# Patient Record
Sex: Female | Born: 1950 | Race: White | Hispanic: No | Marital: Married | Smoking: Never smoker
Health system: Southern US, Community
[De-identification: ages and names within clinical notes are randomized; demographics above are authoritative.]

## PROBLEM LIST (undated history)

## (undated) DIAGNOSIS — F32A Depression, unspecified: Secondary | ICD-10-CM

## (undated) DIAGNOSIS — N390 Urinary tract infection, site not specified: Secondary | ICD-10-CM

## (undated) DIAGNOSIS — G43909 Migraine, unspecified, not intractable, without status migrainosus: Secondary | ICD-10-CM

## (undated) DIAGNOSIS — F329 Major depressive disorder, single episode, unspecified: Secondary | ICD-10-CM

## (undated) DIAGNOSIS — A499 Bacterial infection, unspecified: Secondary | ICD-10-CM

## (undated) DIAGNOSIS — B019 Varicella without complication: Secondary | ICD-10-CM

## (undated) HISTORY — DX: Depression, unspecified: F32.A

## (undated) HISTORY — DX: Major depressive disorder, single episode, unspecified: F32.9

## (undated) HISTORY — DX: Urinary tract infection, site not specified: N39.0

## (undated) HISTORY — DX: Migraine, unspecified, not intractable, without status migrainosus: G43.909

## (undated) HISTORY — DX: Varicella without complication: B01.9

## (undated) HISTORY — DX: Bacterial infection, unspecified: A49.9

---
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
## 2004-10-09 ENCOUNTER — Ambulatory Visit: Payer: Self-pay | Admitting: Internal Medicine

## 2006-02-07 ENCOUNTER — Ambulatory Visit: Payer: Self-pay | Admitting: Internal Medicine

## 2007-02-24 ENCOUNTER — Ambulatory Visit: Payer: Self-pay | Admitting: Internal Medicine

## 2008-03-03 ENCOUNTER — Ambulatory Visit: Payer: Self-pay | Admitting: Internal Medicine

## 2008-03-22 ENCOUNTER — Ambulatory Visit: Payer: Self-pay | Admitting: Internal Medicine

## 2009-04-05 ENCOUNTER — Ambulatory Visit: Payer: Self-pay | Admitting: Internal Medicine

## 2010-04-26 ENCOUNTER — Ambulatory Visit: Payer: Self-pay | Admitting: Internal Medicine

## 2011-04-30 ENCOUNTER — Ambulatory Visit: Payer: Self-pay | Admitting: Internal Medicine

## 2012-01-18 ENCOUNTER — Other Ambulatory Visit: Payer: Self-pay | Admitting: *Deleted

## 2012-01-18 MED ORDER — MELOXICAM 7.5 MG PO TABS: 7.5000 mg | ORAL_TABLET | Freq: Every day | ORAL | Status: DC

## 2012-01-18 NOTE — Telephone Encounter (Signed)
R'cd fax from [employer] for refill of Meloxicam-rx faxed to pharmacy.

## 2012-02-20 ENCOUNTER — Telehealth: Payer: Self-pay | Admitting: Internal Medicine

## 2012-02-20 NOTE — Telephone Encounter (Signed)
Refill request for evista 60 mg tablet  Sig: take 1 tablet daily Patient has an appointment on 04/18/12  Omeprazole dr 20 mg capsu Sig: take 1 capsule daily

## 2012-02-20 NOTE — Telephone Encounter (Signed)
Refill request for burpropion sr 150 mg tablet Take 1 tablet by mouth once a day

## 2012-02-25 MED ORDER — OMEPRAZOLE 20 MG PO CPDR: 20.0000 mg | DELAYED_RELEASE_CAPSULE | Freq: Every day | ORAL | Status: DC

## 2012-02-25 MED ORDER — RALOXIFENE HCL 60 MG PO TABS: 60.0000 mg | ORAL_TABLET | Freq: Every day | ORAL | Status: DC

## 2012-02-25 MED ORDER — BUPROPION HCL ER (SR) 150 MG PO TB12: 150.0000 mg | ORAL_TABLET | Freq: Every day | ORAL | Status: DC

## 2012-02-25 NOTE — Telephone Encounter (Signed)
Sent in to pharmacy.  

## 2012-04-18 ENCOUNTER — Encounter: Payer: Self-pay | Admitting: Internal Medicine

## 2012-04-18 ENCOUNTER — Ambulatory Visit (INDEPENDENT_AMBULATORY_CARE_PROVIDER_SITE_OTHER): Payer: No Typology Code available for payment source | Admitting: Internal Medicine

## 2012-04-18 ENCOUNTER — Other Ambulatory Visit (HOSPITAL_COMMUNITY)
Admission: RE | Admit: 2012-04-18 | Discharge: 2012-04-18 | Disposition: A | Discharge status: Home / Self Care | Payer: No Typology Code available for payment source | Source: Ambulatory Visit | Attending: Internal Medicine | Admitting: Internal Medicine

## 2012-04-18 VITALS — BP 120/80 | HR 75 | Temp 98.0°F | Ht 62.0 in | Wt 121.2 lb

## 2012-04-18 DIAGNOSIS — Z01419 Encounter for gynecological examination (general) (routine) without abnormal findings: Secondary | ICD-10-CM | POA: Insufficient documentation

## 2012-04-18 DIAGNOSIS — Z139 Encounter for screening, unspecified: Secondary | ICD-10-CM

## 2012-04-18 DIAGNOSIS — R5383 Other fatigue: Secondary | ICD-10-CM

## 2012-04-18 DIAGNOSIS — R5381 Other malaise: Secondary | ICD-10-CM

## 2012-04-18 DIAGNOSIS — M899 Disorder of bone, unspecified: Secondary | ICD-10-CM

## 2012-04-18 DIAGNOSIS — M8580 Other specified disorders of bone density and structure, unspecified site: Secondary | ICD-10-CM

## 2012-04-18 DIAGNOSIS — Z1151 Encounter for screening for human papillomavirus (HPV): Secondary | ICD-10-CM | POA: Insufficient documentation

## 2012-04-19 ENCOUNTER — Encounter: Payer: Self-pay | Admitting: Internal Medicine

## 2012-04-19 DIAGNOSIS — M8580 Other specified disorders of bone density and structure, unspecified site: Secondary | ICD-10-CM | POA: Insufficient documentation

## 2012-04-19 MED ORDER — ESTRADIOL 0.1 MG/GM VA CREA: TOPICAL_CREAM | VAGINAL | Status: DC

## 2012-04-19 NOTE — Progress Notes (Signed)
Subjective:    Patient ID: Yolanda Perkins, female    DOB: [birth year], 62 y.o.   MRN: [id number]  HPI 62 year old female with past history of FCD who comes in today for her complete physical exam.  She states she is doing well.  No chest pain or tightness.  No sob.  Breathing stable.  Takes the meloxicam prn.  Controls her pain.  Overall she feels good.  No acid reflux.  Taking omeprazole.  Bowels stable.   Past Medical History  Diagnosis Date  . Chicken pox   . Depression   . Migraines   . Urinary tract bacterial infections     Current Outpatient Prescriptions on File Prior to Visit  Medication Sig Dispense Refill  . buPROPion (WELLBUTRIN SR) 150 MG 12 hr tablet Take 1 tablet (150 mg total) by mouth daily.  30 tablet  1  . Calcium Carbonate-Vit D-Min 600-400 MG-UNIT TABS Take 1 tablet by mouth 2 (two) times daily.      . meloxicam (MOBIC) 7.5 MG tablet Take 1 tablet (7.5 mg total) by mouth daily. With meals  30 tablet  2  . omeprazole (PRILOSEC) 20 MG capsule Take 1 capsule (20 mg total) by mouth daily.  30 capsule  1  . raloxifene (EVISTA) 60 MG tablet Take 1 tablet (60 mg total) by mouth daily.  30 tablet  1  . SUMAtriptan (IMITREX) 50 MG tablet Take 1/2 tablet my mouth as  directed as needed.      . traZODone (DESYREL) 50 MG tablet Take 1/2 tablet by mouth at bedtime as needed.        Review of Systems Patient denies any headache, lightheadedness or dizziness.  No significant sinus or allergy symptoms.  No chest pain, tightness or palpitations.  No increased shortness of breath, cough or congestion. Breathing stable.   No nausea or vomiting.  No abdominal pain or cramping.  No bowel change, such as diarrhea, constipation, BRBPR or melana.  No urine change.   Taking the meloxicam prn.  Tolerating.  Uses estrace cream prn.  Doing well.  No vaginal problems.      Objective:   Physical Exam Filed Vitals:   04/18/12 1423  BP: 120/80  Pulse: 75  Temp: 98 F (69.63 C)   62 year old  female in no acute distress.   HEENT:  Nares- clear.  Oropharynx - without lesions. NECK:  Supple.  Nontender.  No audible bruit.  HEART:  Appears to be regular. LUNGS:  No crackles or wheezing audible.  Respirations even and unlabored.  RADIAL PULSE:  Equal bilaterally.    BREASTS:  No nipple discharge or nipple retraction present.  Could not appreciate any distinct nodules or axillary adenopathy.  ABDOMEN:  Soft, nontender.  Bowel sounds present and normal.  No audible abdominal bruit.  GU:  Normal external genitalia.  Vaginal vault without lesions.  Cervix identified.  Pap performed. Could not appreciate any adnexal masses or tenderness.   RECTAL:  Heme negative.   EXTREMITIES:  No increased edema present.  DP pulses palpable and equal bilaterally.           Assessment & Plan:  INCREASED PSYCHOSOCIAL STRESSORS.  Doing well on the Wellbutrin and trazodone.  Follow.   CARDIOVASCULAR.  Asymptomatic.   FATIGUE.  Did report some fatigue, but overall is doing well.  Will check cbc, met c and tsh.    MSK.  Had knee pain.  Saw Dr Zachery Dauer.  S/p physical therapy.  Uses mobic prn.  Follow.    HEALTH MAINTENANCE.  Physical today.  Mammogram 04/30/11 - BiRADS II.  Schedule a follow up mammogram.  Colonoscopy 12/30/02 - diverticulosis.  Check cholesterol.

## 2012-04-19 NOTE — Assessment & Plan Note (Signed)
Continue calcium and vitamin D, weight bearing exercise.  Check vitamin D level.  Last bone density revealed osteopenia - no significant change.  Continue Evista.

## 2012-04-23 ENCOUNTER — Encounter: Payer: Self-pay | Admitting: *Deleted

## 2012-04-29 ENCOUNTER — Other Ambulatory Visit: Payer: No Typology Code available for payment source

## 2012-05-01 ENCOUNTER — Other Ambulatory Visit: Payer: No Typology Code available for payment source

## 2012-05-06 ENCOUNTER — Other Ambulatory Visit (INDEPENDENT_AMBULATORY_CARE_PROVIDER_SITE_OTHER): Payer: No Typology Code available for payment source

## 2012-05-06 DIAGNOSIS — R5381 Other malaise: Secondary | ICD-10-CM

## 2012-05-06 DIAGNOSIS — R5383 Other fatigue: Secondary | ICD-10-CM

## 2012-05-06 DIAGNOSIS — Z139 Encounter for screening, unspecified: Secondary | ICD-10-CM

## 2012-05-06 LAB — LIPID PANEL
HDL: 51 mg/dL (ref >39.00)
LDL Cholesterol: 109 mg/dL — ABNORMAL HIGH (ref 0–99)
Total CHOL/HDL Ratio: 3
VLDL: 16 mg/dL (ref 0.0–40.0)

## 2012-05-06 LAB — COMPREHENSIVE METABOLIC PANEL
ALT: 22 U/L (ref 0–35)
AST: 25 U/L (ref 0–37)
Alkaline Phosphatase: 65 U/L (ref 39–117)
Potassium: 4.7 mEq/L (ref 3.5–5.1)
Sodium: 141 mEq/L (ref 135–145)
Total Bilirubin: 0.8 mg/dL (ref 0.3–1.2)
Total Protein: 6.5 g/dL (ref 6.0–8.3)

## 2012-05-06 LAB — CBC WITH DIFFERENTIAL/PLATELET
Basophils Absolute: 0 10*3/uL (ref 0.0–0.1)
Eosinophils Absolute: 0.2 10*3/uL (ref 0.0–0.7)
HCT: 38.6 % (ref 36.0–46.0)
Lymphs Abs: 1.2 10*3/uL (ref 0.7–4.0)
MCV: 92.9 fl (ref 78.0–100.0)
Monocytes Absolute: 0.3 10*3/uL (ref 0.1–1.0)
Neutrophils Relative %: 64.8 % (ref 43.0–77.0)
Platelets: 203 10*3/uL (ref 150.0–400.0)
RDW: 12.8 % (ref 11.5–14.6)

## 2012-05-06 LAB — TSH: TSH: 1.65 u[IU]/mL (ref 0.35–5.50)

## 2012-05-26 ENCOUNTER — Other Ambulatory Visit: Payer: Self-pay | Admitting: *Deleted

## 2012-05-27 ENCOUNTER — Other Ambulatory Visit (INDEPENDENT_AMBULATORY_CARE_PROVIDER_SITE_OTHER): Payer: No Typology Code available for payment source

## 2012-05-27 DIAGNOSIS — R5383 Other fatigue: Secondary | ICD-10-CM

## 2012-05-27 DIAGNOSIS — R5381 Other malaise: Secondary | ICD-10-CM

## 2012-05-27 LAB — FECAL OCCULT BLOOD, IMMUNOCHEMICAL: Fecal Occult Bld: NEGATIVE

## 2012-05-28 MED ORDER — RALOXIFENE HCL 60 MG PO TABS: 60.0000 mg | ORAL_TABLET | Freq: Every day | ORAL | Status: DC

## 2012-05-28 MED ORDER — MELOXICAM 7.5 MG PO TABS: 7.5000 mg | ORAL_TABLET | Freq: Every day | ORAL | Status: DC

## 2012-05-30 ENCOUNTER — Encounter: Payer: Self-pay | Admitting: Internal Medicine

## 2012-06-19 ENCOUNTER — Ambulatory Visit: Payer: Self-pay | Admitting: Internal Medicine

## 2012-06-20 ENCOUNTER — Encounter: Payer: Self-pay | Admitting: Internal Medicine

## 2012-06-28 ENCOUNTER — Telehealth: Payer: Self-pay | Admitting: Internal Medicine

## 2012-06-28 MED ORDER — BUPROPION HCL ER (SR) 150 MG PO TB12: 150.0000 mg | ORAL_TABLET | Freq: Every day | ORAL | Status: DC

## 2012-06-28 MED ORDER — OMEPRAZOLE 20 MG PO CPDR: 20.0000 mg | DELAYED_RELEASE_CAPSULE | Freq: Every day | ORAL | Status: DC

## 2012-06-28 NOTE — Telephone Encounter (Signed)
Refilled bupropion and omeprazole #30 with 5 refills.

## 2012-07-04 ENCOUNTER — Encounter: Payer: Self-pay | Admitting: Internal Medicine

## 2012-08-01 ENCOUNTER — Other Ambulatory Visit: Payer: Self-pay | Admitting: *Deleted

## 2012-08-01 MED ORDER — MELOXICAM 7.5 MG PO TABS: 7.5000 mg | ORAL_TABLET | Freq: Every day | ORAL | Status: DC

## 2012-10-17 ENCOUNTER — Ambulatory Visit (INDEPENDENT_AMBULATORY_CARE_PROVIDER_SITE_OTHER): Payer: No Typology Code available for payment source | Admitting: Internal Medicine

## 2012-10-17 ENCOUNTER — Encounter: Payer: Self-pay | Admitting: Internal Medicine

## 2012-10-17 VITALS — BP 130/80 | HR 83 | Temp 98.3°F | Ht 62.0 in | Wt 124.2 lb

## 2012-10-17 DIAGNOSIS — M899 Disorder of bone, unspecified: Secondary | ICD-10-CM

## 2012-10-17 DIAGNOSIS — M8580 Other specified disorders of bone density and structure, unspecified site: Secondary | ICD-10-CM

## 2012-10-17 MED ORDER — ESTRADIOL 0.1 MG/GM VA CREA: TOPICAL_CREAM | VAGINAL | Status: DC

## 2012-10-17 NOTE — Progress Notes (Signed)
Subjective:    Patient ID: Yolanda Perkins, female    DOB: 1951-03-06, 62 y.o.   MRN: [id number]  HPI 62 year old female with past history of FCD who comes in today for a scheduled follow up.  She states she is doing well.  No chest pain or tightness.  No sob.  Breathing stable.  Takes the meloxicam prn.  Controls her pain.  Overall she feels good.  No acid reflux.  Taking omeprazole.  Bowels stable.  Does report some increased stiffness in her hands/fingers.  Left third finger with some swelling.  Desires no further intervention at this time.     Past Medical History  Diagnosis Date  . Chicken pox   . Depression   . Migraines   . Urinary tract bacterial infections     Current Outpatient Prescriptions on File Prior to Visit  Medication Sig Dispense Refill  . buPROPion (WELLBUTRIN SR) 150 MG 12 hr tablet Take 1 tablet (150 mg total) by mouth daily.  30 tablet  5  . Calcium Carbonate-Vit D-Min 600-400 MG-UNIT TABS Take 1 tablet by mouth 2 (two) times daily.      . Cholecalciferol (VITAMIN D3) 1000 UNITS CAPS Take 1 capsule by mouth daily.      Marland Kitchen estradiol (ESTRACE) 0.1 MG/GM vaginal cream Use as directed.  42.5 g  2  . meloxicam (MOBIC) 7.5 MG tablet Take 1 tablet (7.5 mg total) by mouth daily. With meals  30 tablet  2  . Misc Natural Products (GLUCOSAMINE-CHONDROITIN SULF) TABS Take 1 tablet by mouth daily.      . Multiple Vitamin (MULTIVITAMIN) tablet Take 1 tablet by mouth daily.      Marland Kitchen omeprazole (PRILOSEC) 20 MG capsule Take 1 capsule (20 mg total) by mouth daily.  30 capsule  5  . pyridoxine (B-6) 100 MG tablet Take 100 mg by mouth daily.      . raloxifene (EVISTA) 60 MG tablet Take 1 tablet (60 mg total) by mouth daily.  30 tablet  5  . SUMAtriptan (IMITREX) 50 MG tablet Take 1/2 tablet my mouth as  directed as needed.       No current facility-administered medications on file prior to visit.    Review of Systems Patient denies any headache, lightheadedness or dizziness.  No  significant sinus or allergy symptoms.  No chest pain, tightness or palpitations.  No increased shortness of breath, cough or congestion.  Breathing stable.   No nausea or vomiting.  No abdominal pain or cramping.  No bowel change, such as diarrhea, constipation, BRBPR or melana.  No urine change.   Taking the meloxicam prn.  Tolerating.  Uses estrace cream prn.  Doing well.  No vaginal problems.      Objective:   Physical Exam  Filed Vitals:   10/17/12 0902  BP: 130/80  Pulse: 83  Temp: 98.3 F (62.9 C)   62 year old female in no acute distress.   HEENT:  Nares- clear.  Oropharynx - without lesions. NECK:  Supple.  Nontender.  No audible bruit.  HEART:  Appears to be regular. LUNGS:  No crackles or wheezing audible.  Respirations even and unlabored.  RADIAL PULSE:  Equal bilaterally.  ABDOMEN:  Soft, nontender.  Bowel sounds present and normal.  No audible abdominal bruit.    EXTREMITIES:  No increased edema present.  DP pulses palpable and equal bilaterally.           Assessment & Plan:  INCREASED PSYCHOSOCIAL STRESSORS.  Doing well on the Wellbutrin and trazodone.  Follow.   CARDIOVASCULAR.  Asymptomatic.    MSK.  Had knee pain.  Saw Dr Zachery Dauer.  S/p physical therapy.  Uses mobic prn.  Follow.    HEALTH MAINTENANCE.  Physical 04/18/12.  Mammogram 06/19/12 - BiRADS II.  Colonoscopy 12/30/02 - diverticulosis.  Cholesterol check 05/06/12 revealed total cholesterol 176 triglycerides 80, HDL 51 and LDL 109.

## 2012-10-17 NOTE — Assessment & Plan Note (Addendum)
Continue calcium and vitamin D, weight bearing exercise.  Follow vitamin D level.  Last bone density revealed osteopenia - no significant change.  Continue Evista.    

## 2012-10-19 ENCOUNTER — Encounter: Payer: Self-pay | Admitting: Internal Medicine

## 2012-11-18 ENCOUNTER — Telehealth: Payer: Self-pay | Admitting: *Deleted

## 2012-11-18 NOTE — Telephone Encounter (Signed)
Is she still needing this?  How often is she taking?

## 2012-11-18 NOTE — Telephone Encounter (Signed)
Okay to fill? 

## 2012-11-18 NOTE — Telephone Encounter (Signed)
Refill request  Skelaxin 800 mg tablets  #30   Take as direct

## 2012-11-19 NOTE — Telephone Encounter (Signed)
LMTCB on home/cell #.

## 2012-11-20 MED ORDER — METAXALONE 800 MG PO TABS: ORAL_TABLET | ORAL | Status: DC

## 2012-11-20 NOTE — Telephone Encounter (Signed)
rx sent in for skelaxin.  Let us know if persistent problems.

## 2012-11-20 NOTE — Addendum Note (Signed)
Addended by: Charm Barges on: 11/20/2012 10:37 AM   Modules accepted: Orders

## 2012-11-20 NOTE — Telephone Encounter (Signed)
Pt.notified

## 2012-11-20 NOTE — Telephone Encounter (Signed)
Pt states that she has been doing some heavy lifting outdoors & remembered that it helped with back pain in the past. Was hoping to get a few pills. Not even a whole Rx really. It is an old Rx that forgot to mention that she had because she hasn't needed it in a long time.

## 2013-03-11 ENCOUNTER — Other Ambulatory Visit: Payer: Self-pay | Admitting: *Deleted

## 2013-03-11 MED ORDER — BUPROPION HCL ER (SR) 150 MG PO TB12: 150.0000 mg | ORAL_TABLET | Freq: Every day | ORAL | Status: DC

## 2013-03-11 MED ORDER — MELOXICAM 7.5 MG PO TABS: 7.5000 mg | ORAL_TABLET | Freq: Every day | ORAL | Status: DC

## 2013-03-11 MED ORDER — RALOXIFENE HCL 60 MG PO TABS: 60.0000 mg | ORAL_TABLET | Freq: Every day | ORAL | Status: DC

## 2013-03-11 MED ORDER — OMEPRAZOLE 20 MG PO CPDR: 20.0000 mg | DELAYED_RELEASE_CAPSULE | Freq: Every day | ORAL | Status: DC

## 2013-03-11 NOTE — Telephone Encounter (Signed)
Ok to refill 

## 2013-04-24 ENCOUNTER — Encounter: Payer: No Typology Code available for payment source | Admitting: Internal Medicine

## 2013-05-28 ENCOUNTER — Encounter: Payer: No Typology Code available for payment source | Admitting: Internal Medicine

## 2013-06-01 ENCOUNTER — Telehealth: Payer: Self-pay | Admitting: Internal Medicine

## 2013-06-01 DIAGNOSIS — Z1239 Encounter for other screening for malignant neoplasm of breast: Secondary | ICD-10-CM

## 2013-06-01 NOTE — Telephone Encounter (Signed)
I have placed the order for the mammogram.  Her last mammogram was 06/19/12.  It will need be scheduled for after 06/19/13.  Amber will be contacting her with an appt date and time.

## 2013-06-01 NOTE — Telephone Encounter (Signed)
Pt called to rs cancelled appt due to weather.  Next available CPE not until 5/1; scheduled.  Pt asking if her mammogram can be ordered now so she does not have to wait until May.  States she is unsure if it is diagnostic or routine this time.  [place].

## 2013-06-02 NOTE — Telephone Encounter (Signed)
Left message, notifying patient order has been placed and Amber will call her with appointment when scheduled

## 2013-06-18 ENCOUNTER — Encounter: Payer: Self-pay | Admitting: Internal Medicine

## 2013-06-18 ENCOUNTER — Ambulatory Visit (INDEPENDENT_AMBULATORY_CARE_PROVIDER_SITE_OTHER): Payer: No Typology Code available for payment source | Admitting: Internal Medicine

## 2013-06-18 VITALS — BP 120/78 | HR 88 | Temp 97.6°F | Ht 62.5 in | Wt 123.8 lb

## 2013-06-18 DIAGNOSIS — Z13220 Encounter for screening for lipoid disorders: Secondary | ICD-10-CM

## 2013-06-18 DIAGNOSIS — R5383 Other fatigue: Secondary | ICD-10-CM

## 2013-06-18 DIAGNOSIS — R5381 Other malaise: Secondary | ICD-10-CM

## 2013-06-18 DIAGNOSIS — M899 Disorder of bone, unspecified: Secondary | ICD-10-CM

## 2013-06-18 DIAGNOSIS — M8580 Other specified disorders of bone density and structure, unspecified site: Secondary | ICD-10-CM

## 2013-06-18 DIAGNOSIS — M949 Disorder of cartilage, unspecified: Secondary | ICD-10-CM

## 2013-06-18 DIAGNOSIS — IMO0001 Reserved for inherently not codable concepts without codable children: Secondary | ICD-10-CM

## 2013-06-18 MED ORDER — ESTRADIOL 0.1 MG/GM VA CREA: TOPICAL_CREAM | VAGINAL | Status: DC

## 2013-06-18 NOTE — Progress Notes (Signed)
Pre-visit discussion using our clinic review tool. No additional management support is needed unless otherwise documented below in the visit note.  

## 2013-06-21 ENCOUNTER — Encounter: Payer: Self-pay | Admitting: Internal Medicine

## 2013-06-21 NOTE — Progress Notes (Signed)
Subjective:    Patient ID: Yolanda Perkins, female    DOB: 03-23-1951, 63 y.o.   MRN: [id number]  HPI 63 year old female with past history of FCD who comes in today for her physical exam.   She states she is doing well.  No chest pain or tightness.  No sob.  Breathing stable.  Takes the meloxicam prn.  Controls her pain.  Overall she feels good. No acid reflux.  Taking omeprazole.  Bowels stable.  Some pain and stiffness in her neck and shoulders.  Sees a Restaurant manager, fast food.  Better.  Plans to join [employer].  Overall feels good and feels she is doing well.      Past Medical History  Diagnosis Date  . Chicken pox   . Depression   . Migraines   . Urinary tract bacterial infections     Current Outpatient Prescriptions on File Prior to Visit  Medication Sig Dispense Refill  . buPROPion (WELLBUTRIN SR) 150 MG 12 hr tablet Take 1 tablet (150 mg total) by mouth daily.  30 tablet  5  . Calcium Carbonate-Vit D-Min 600-400 MG-UNIT TABS Take 1 tablet by mouth 2 (two) times daily.      . Cholecalciferol (VITAMIN D3) 1000 UNITS CAPS Take 1 capsule by mouth daily.      . meloxicam (MOBIC) 7.5 MG tablet Take 1 tablet (7.5 mg total) by mouth daily. With meals  30 tablet  2  . metaxalone (SKELAXIN) 800 MG tablet 1/2 - 1 tablet q pm prn.  20 tablet  0  . Misc Natural Products (GLUCOSAMINE-CHONDROITIN SULF) TABS Take 1 tablet by mouth daily.      . Multiple Vitamin (MULTIVITAMIN) tablet Take 1 tablet by mouth daily.      Marland Kitchen omeprazole (PRILOSEC) 20 MG capsule Take 1 capsule (20 mg total) by mouth daily.  30 capsule  5  . pyridoxine (B-6) 100 MG tablet Take 100 mg by mouth daily.      . raloxifene (EVISTA) 60 MG tablet Take 1 tablet (60 mg total) by mouth daily.  30 tablet  5  . SUMAtriptan (IMITREX) 50 MG tablet Take 1/2 tablet my mouth as  directed as needed.       No current facility-administered medications on file prior to visit.    Review of Systems Patient denies any headache, lightheadedness or  dizziness.  No significant sinus or allergy symptoms.  No chest pain, tightness or palpitations.  No increased shortness of breath, cough or congestion.  Breathing stable.   No nausea or vomiting.  No abdominal pain or cramping.  No bowel change, such as diarrhea, constipation, BRBPR or melana.  No urine change.   Taking the meloxicam prn.  Tolerating.  Uses estrace cream prn.  Doing well.  No vaginal problems.  Chiropractor helps her neck and shoulders.       Objective:   Physical Exam  Filed Vitals:   06/18/13 0956  BP: 120/78  Pulse: 88  Temp: 97.6 F (92.36 C)   63 year old female in no acute distress.   HEENT:  Nares- clear.  Oropharynx - without lesions. NECK:  Supple.  Nontender.  No audible bruit.  HEART:  Appears to be regular. LUNGS:  No crackles or wheezing audible.  Respirations even and unlabored.  RADIAL PULSE:  Equal bilaterally.    BREASTS:  No nipple discharge or nipple retraction present.  Could not appreciate any distinct nodules or axillary adenopathy.  ABDOMEN:  Soft, nontender.  Bowel sounds  present and normal.  No audible abdominal bruit.  GU:  Not performed.     EXTREMITIES:  No increased edema present.  DP pulses palpable and equal bilaterally.           Assessment & Plan:  INCREASED PSYCHOSOCIAL STRESSORS.  Doing well on the Wellbutrin and trazodone.  Follow.   CARDIOVASCULAR.  Asymptomatic.    MSK.  Had knee pain.  Saw Dr Drema Dallas.  S/p physical therapy.  Uses mobic prn.  Follow.    HEALTH MAINTENANCE.  Physical today.  Mammogram 06/19/12 - BiRADS II.  Schedule f/u mammogram.  Colonoscopy 12/30/02 - diverticulosis.    I spent 25 minutes with the patient and more than 50% of the time was spent in consultation regarding the above.

## 2013-06-21 NOTE — Assessment & Plan Note (Signed)
Continue calcium and vitamin D, weight bearing exercise.  Follow vitamin D level.  Last bone density revealed osteopenia - no significant change.  Continue Evista.    

## 2013-06-23 ENCOUNTER — Ambulatory Visit: Payer: Self-pay | Admitting: Internal Medicine

## 2013-06-23 LAB — HM MAMMOGRAPHY: HM Mammogram: NEGATIVE

## 2013-06-24 ENCOUNTER — Encounter: Payer: Self-pay | Admitting: Internal Medicine

## 2013-06-25 ENCOUNTER — Other Ambulatory Visit (INDEPENDENT_AMBULATORY_CARE_PROVIDER_SITE_OTHER): Payer: No Typology Code available for payment source

## 2013-06-25 DIAGNOSIS — R5381 Other malaise: Secondary | ICD-10-CM

## 2013-06-25 DIAGNOSIS — M8580 Other specified disorders of bone density and structure, unspecified site: Secondary | ICD-10-CM

## 2013-06-25 DIAGNOSIS — IMO0001 Reserved for inherently not codable concepts without codable children: Secondary | ICD-10-CM

## 2013-06-25 DIAGNOSIS — R5383 Other fatigue: Secondary | ICD-10-CM

## 2013-06-25 DIAGNOSIS — Z13220 Encounter for screening for lipoid disorders: Secondary | ICD-10-CM

## 2013-06-25 DIAGNOSIS — T7589XA Other specified effects of external causes, initial encounter: Secondary | ICD-10-CM

## 2013-06-25 LAB — CBC WITH DIFFERENTIAL/PLATELET
BASOS ABS: 0 10*3/uL (ref 0.0–0.1)
Basophils Relative: 0.4 % (ref 0.0–3.0)
Eosinophils Absolute: 0.2 10*3/uL (ref 0.0–0.7)
Eosinophils Relative: 4 % (ref 0.0–5.0)
HCT: 38.6 % (ref 36.0–46.0)
HEMOGLOBIN: 12.9 g/dL (ref 12.0–15.0)
LYMPHS ABS: 1.1 10*3/uL (ref 0.7–4.0)
Lymphocytes Relative: 25.5 % (ref 12.0–46.0)
MCHC: 33.4 g/dL (ref 30.0–36.0)
MCV: 94.2 fl (ref 78.0–100.0)
MONOS PCT: 4.8 % (ref 3.0–12.0)
Monocytes Absolute: 0.2 10*3/uL (ref 0.1–1.0)
NEUTROS ABS: 2.9 10*3/uL (ref 1.4–7.7)
Neutrophils Relative %: 65.3 % (ref 43.0–77.0)
Platelets: 190 10*3/uL (ref 150.0–400.0)
RBC: 4.09 Mil/uL (ref 3.87–5.11)
RDW: 13.1 % (ref 11.5–14.6)
WBC: 4.4 10*3/uL — ABNORMAL LOW (ref 4.5–10.5)

## 2013-06-25 LAB — COMPREHENSIVE METABOLIC PANEL
ALT: 22 U/L (ref 0–35)
AST: 29 U/L (ref 0–37)
Albumin: 3.9 g/dL (ref 3.5–5.2)
Alkaline Phosphatase: 63 U/L (ref 39–117)
BUN: 15 mg/dL (ref 6–23)
CO2: 30 meq/L (ref 19–32)
CREATININE: 1 mg/dL (ref 0.4–1.2)
Calcium: 9.3 mg/dL (ref 8.4–10.5)
Chloride: 105 mEq/L (ref 96–112)
GFR: 59.83 mL/min — AB (ref >60.00)
GLUCOSE: 92 mg/dL (ref 70–99)
Potassium: 4.3 mEq/L (ref 3.5–5.1)
Sodium: 139 mEq/L (ref 135–145)
Total Bilirubin: 0.7 mg/dL (ref 0.3–1.2)
Total Protein: 6.2 g/dL (ref 6.0–8.3)

## 2013-06-25 LAB — TSH: TSH: 1.6 u[IU]/mL (ref 0.35–5.50)

## 2013-06-25 LAB — LIPID PANEL
Cholesterol: 192 mg/dL (ref 0–200)
HDL: 55.3 mg/dL (ref >39.00)
LDL Cholesterol: 121 mg/dL — ABNORMAL HIGH (ref 0–99)
TRIGLYCERIDES: 80 mg/dL (ref 0.0–149.0)
Total CHOL/HDL Ratio: 3
VLDL: 16 mg/dL (ref 0.0–40.0)

## 2013-06-26 LAB — HIV ANTIBODY (ROUTINE TESTING W REFLEX): HIV: NONREACTIVE

## 2013-06-26 LAB — HEPATITIS B CORE ANTIBODY, IGM: Hep B C IgM: NONREACTIVE

## 2013-06-26 LAB — HEPATITIS C ANTIBODY: HCV Ab: NEGATIVE

## 2013-06-26 LAB — HEPATITIS B SURFACE ANTIGEN: HEP B S AG: NEGATIVE

## 2013-06-26 LAB — VITAMIN D 25 HYDROXY (VIT D DEFICIENCY, FRACTURES): VIT D 25 HYDROXY: 72 ng/mL (ref 30–89)

## 2013-06-26 LAB — HEPATITIS B CORE ANTIBODY, TOTAL: HEP B C TOTAL AB: NONREACTIVE

## 2013-06-26 LAB — HEPATITIS B SURFACE ANTIBODY,QUALITATIVE: HEP B S AB: POSITIVE — AB

## 2013-06-29 LAB — HEPATITIS B E ANTIBODY: Hepatitis Be Antibody: NONREACTIVE

## 2013-06-29 LAB — HEPATITIS B E ANTIGEN: Hepatitis Be Antigen: NONREACTIVE

## 2013-07-22 ENCOUNTER — Encounter: Payer: Self-pay | Admitting: Internal Medicine

## 2013-07-31 ENCOUNTER — Encounter: Payer: No Typology Code available for payment source | Admitting: Internal Medicine

## 2013-08-10 ENCOUNTER — Other Ambulatory Visit: Payer: Self-pay | Admitting: *Deleted

## 2013-08-10 MED ORDER — MELOXICAM 7.5 MG PO TABS: 7.5000 mg | ORAL_TABLET | Freq: Every day | ORAL | Status: DC

## 2013-08-10 NOTE — Telephone Encounter (Signed)
Refill

## 2013-08-10 NOTE — Telephone Encounter (Signed)
Refilled meloxicam #30 with one refill.  rx on your desk.

## 2013-11-25 ENCOUNTER — Telehealth: Payer: Self-pay | Admitting: *Deleted

## 2013-11-25 MED ORDER — MELOXICAM 7.5 MG PO TABS: 7.5000 mg | ORAL_TABLET | Freq: Every day | ORAL | Status: DC

## 2013-11-25 NOTE — Telephone Encounter (Signed)
Fax from [place] requesting Meloxicam 7.5mg  #30, one tablet daily.  Last refill 6.11.15, last OV 3.19.15.  Please advise refill

## 2013-11-25 NOTE — Telephone Encounter (Signed)
rx sent in for meloxicam #30 with one refill.

## 2013-12-21 ENCOUNTER — Encounter: Payer: Self-pay | Admitting: Internal Medicine

## 2013-12-21 ENCOUNTER — Ambulatory Visit (INDEPENDENT_AMBULATORY_CARE_PROVIDER_SITE_OTHER): Payer: No Typology Code available for payment source | Admitting: Internal Medicine

## 2013-12-21 VITALS — BP 110/70 | HR 74 | Temp 98.2°F | Ht 62.5 in | Wt 126.2 lb

## 2013-12-21 DIAGNOSIS — F439 Reaction to severe stress, unspecified: Secondary | ICD-10-CM

## 2013-12-21 DIAGNOSIS — Z79899 Other long term (current) drug therapy: Secondary | ICD-10-CM

## 2013-12-21 DIAGNOSIS — M899 Disorder of bone, unspecified: Secondary | ICD-10-CM

## 2013-12-21 DIAGNOSIS — M8580 Other specified disorders of bone density and structure, unspecified site: Secondary | ICD-10-CM

## 2013-12-21 DIAGNOSIS — Z733 Stress, not elsewhere classified: Secondary | ICD-10-CM

## 2013-12-21 DIAGNOSIS — M949 Disorder of cartilage, unspecified: Secondary | ICD-10-CM

## 2013-12-21 DIAGNOSIS — Z23 Encounter for immunization: Secondary | ICD-10-CM

## 2013-12-21 LAB — COMPREHENSIVE METABOLIC PANEL
ALT: 19 U/L (ref 0–35)
AST: 26 U/L (ref 0–37)
Albumin: 3.9 g/dL (ref 3.5–5.2)
Alkaline Phosphatase: 65 U/L (ref 39–117)
BILIRUBIN TOTAL: 0.7 mg/dL (ref 0.2–1.2)
BUN: 20 mg/dL (ref 6–23)
CO2: 31 mEq/L (ref 19–32)
CREATININE: 1 mg/dL (ref 0.4–1.2)
Calcium: 9.1 mg/dL (ref 8.4–10.5)
Chloride: 104 mEq/L (ref 96–112)
GFR: 62.65 mL/min (ref >60.00)
GLUCOSE: 87 mg/dL (ref 70–99)
Potassium: 4.5 mEq/L (ref 3.5–5.1)
Sodium: 142 mEq/L (ref 135–145)
Total Protein: 6.4 g/dL (ref 6.0–8.3)

## 2013-12-21 MED ORDER — RALOXIFENE HCL 60 MG PO TABS: 60.0000 mg | ORAL_TABLET | Freq: Every day | ORAL | Status: DC

## 2013-12-21 MED ORDER — BUPROPION HCL ER (SR) 150 MG PO TB12: 150.0000 mg | ORAL_TABLET | Freq: Every day | ORAL | Status: DC

## 2013-12-21 MED ORDER — MELOXICAM 7.5 MG PO TABS: 7.5000 mg | ORAL_TABLET | Freq: Every day | ORAL | Status: DC

## 2013-12-21 MED ORDER — OMEPRAZOLE 20 MG PO CPDR: 20.0000 mg | DELAYED_RELEASE_CAPSULE | Freq: Every day | ORAL | Status: DC

## 2013-12-21 NOTE — Progress Notes (Signed)
Pre visit review using our clinic review tool, if applicable. No additional management support is needed unless otherwise documented below in the visit note. 

## 2013-12-21 NOTE — Progress Notes (Signed)
Subjective:    Patient ID: Yolanda Perkins, female    DOB: 1951-01-18, 63 y.o.   MRN: [id number]  HPI 63 year old female with past history of FCD who comes in today for a scheduled follow up.   She states she is doing relatively well.  Has been under increased stress recently.  Her father has been in the hospital and then rehab.  Now in assisted living.  She is traveling back and forth taking care of his issues and his house.  Work has been stressful.  Has good support.  Overall she feels she is doing relatively well.  No chest pain or tightness.  No sob.  Breathing stable.  Takes the meloxicam prn.  States lately has been taking on a daily basis.   Controls her pain.  No acid reflux.  Taking omeprazole. Bowels stable.  Occasionally loose with increased stress.       Past Medical History  Diagnosis Date  . Chicken pox   . Depression   . Migraines   . Urinary tract bacterial infections     Current Outpatient Prescriptions on File Prior to Visit  Medication Sig Dispense Refill  . buPROPion (WELLBUTRIN SR) 150 MG 12 hr tablet Take 1 tablet (150 mg total) by mouth daily.  30 tablet  5  . Calcium Carbonate-Vit D-Min 600-400 MG-UNIT TABS Take 1 tablet by mouth 2 (two) times daily.      . Cholecalciferol (VITAMIN D3) 1000 UNITS CAPS Take 1 capsule by mouth daily.      Marland Kitchen estradiol (ESTRACE) 0.1 MG/GM vaginal cream Use as directed.  42.5 g  2  . meloxicam (MOBIC) 7.5 MG tablet Take 1 tablet (7.5 mg total) by mouth daily. With meals  30 tablet  1  . metaxalone (SKELAXIN) 800 MG tablet 1/2 - 1 tablet q pm prn.  20 tablet  0  . Misc Natural Products (GLUCOSAMINE-CHONDROITIN SULF) TABS Take 1 tablet by mouth daily.      . Multiple Vitamin (MULTIVITAMIN) tablet Take 1 tablet by mouth daily.      Marland Kitchen omeprazole (PRILOSEC) 20 MG capsule Take 1 capsule (20 mg total) by mouth daily.  30 capsule  5  . pyridoxine (B-6) 100 MG tablet Take 100 mg by mouth daily.      . raloxifene (EVISTA) 60 MG tablet Take 1 tablet  (60 mg total) by mouth daily.  30 tablet  5  . SUMAtriptan (IMITREX) 50 MG tablet Take 1/2 tablet my mouth as  directed as needed.       No current facility-administered medications on file prior to visit.    Review of Systems Patient denies any headache, lightheadedness or dizziness.  No significant sinus or allergy symptoms.  No chest pain, tightness or palpitations.  No increased shortness of breath, cough or congestion.  Breathing stable.   No nausea or vomiting.  No abdominal pain or cramping.  No significant bowel change, such as diarrhea, constipation, BRBPR or melana.  Some loose stool with increased stress.  No urine change.   Taking the meloxicam as outlined.  Tolerating.  Increased stress as outlined.  Overall she feels she is handling things relatively well.        Objective:   Physical Exam  Filed Vitals:   12/21/13 1049  BP: 110/70  Pulse: 74  Temp: 98.2 F (36.8 C)   Blood pressure recheck:  28/7  63 year old female in no acute distress.   HEENT:  Nares- clear.  Oropharynx - without lesions. NECK:  Supple.  Nontender.  No audible bruit.  HEART:  Appears to be regular. LUNGS:  No crackles or wheezing audible.  Respirations even and unlabored.  RADIAL PULSE:  Equal bilaterally. ABDOMEN:  Soft, nontender.  Bowel sounds present and normal.  No audible abdominal bruit.   EXTREMITIES:  No increased edema present.  DP pulses palpable and equal bilaterally.           Assessment & Plan:  INCREASED PSYCHOSOCIAL STRESSORS.  Doing well on the Wellbutrin and trazodone.  Follow.   CARDIOVASCULAR.  Asymptomatic.    MSK.  Had knee pain.  Saw Dr Drema Dallas.  S/p physical therapy.  Uses mobic prn.  Follow.    HEALTH MAINTENANCE.  Physical 06/18/13.  Mammogram 06/23/13 - BiRADS I.  Colonoscopy 12/30/02 - diverticulosis.  Discussed with her regarding f/u colonoscopy.  She wants to hold at this time.  Overdue.  Will let me know when agreeable.

## 2013-12-22 ENCOUNTER — Encounter: Payer: Self-pay | Admitting: Internal Medicine

## 2013-12-24 ENCOUNTER — Encounter: Payer: Self-pay | Admitting: Internal Medicine

## 2013-12-24 DIAGNOSIS — F439 Reaction to severe stress, unspecified: Secondary | ICD-10-CM | POA: Insufficient documentation

## 2013-12-24 NOTE — Assessment & Plan Note (Signed)
On wellbutrin and trazodone.  Feels she is handling things relatively well.  Does not feel needs anything more at this time (i.e,. Formal counseling, etc).  Will follow.

## 2013-12-24 NOTE — Telephone Encounter (Signed)
Unread mychart message mailed to patient 

## 2013-12-24 NOTE — Assessment & Plan Note (Signed)
Continue calcium and vitamin D, weight bearing exercise.  Follow vitamin D level.  Last bone density revealed osteopenia - no significant change.  Continue Evista.

## 2014-04-20 ENCOUNTER — Encounter: Payer: Self-pay | Admitting: Internal Medicine

## 2014-04-20 ENCOUNTER — Ambulatory Visit (INDEPENDENT_AMBULATORY_CARE_PROVIDER_SITE_OTHER): Payer: No Typology Code available for payment source | Admitting: Internal Medicine

## 2014-04-20 ENCOUNTER — Other Ambulatory Visit: Payer: Self-pay | Admitting: Internal Medicine

## 2014-04-20 ENCOUNTER — Telehealth: Payer: Self-pay | Admitting: Internal Medicine

## 2014-04-20 VITALS — BP 129/76 | HR 89 | Temp 98.2°F | Ht 62.5 in | Wt 124.8 lb

## 2014-04-20 DIAGNOSIS — R350 Frequency of micturition: Secondary | ICD-10-CM

## 2014-04-20 DIAGNOSIS — F439 Reaction to severe stress, unspecified: Secondary | ICD-10-CM

## 2014-04-20 DIAGNOSIS — Z658 Other specified problems related to psychosocial circumstances: Secondary | ICD-10-CM

## 2014-04-20 DIAGNOSIS — N3000 Acute cystitis without hematuria: Secondary | ICD-10-CM

## 2014-04-20 DIAGNOSIS — N390 Urinary tract infection, site not specified: Secondary | ICD-10-CM | POA: Insufficient documentation

## 2014-04-20 LAB — POCT URINALYSIS DIPSTICK
BILIRUBIN UA: NEGATIVE
Glucose, UA: NEGATIVE
KETONES UA: NEGATIVE
NITRITE UA: NEGATIVE
PROTEIN UA: 30
Spec Grav, UA: 1.015
Urobilinogen, UA: 0.2
pH, UA: 6

## 2014-04-20 MED ORDER — CIPROFLOXACIN HCL 500 MG PO TABS: 500.0000 mg | ORAL_TABLET | Freq: Two times a day (BID) | ORAL | Status: DC

## 2014-04-20 NOTE — Telephone Encounter (Signed)
Patient has a UTI wants medication called to the pharmacy or come in to have urine checked.

## 2014-04-20 NOTE — Telephone Encounter (Signed)
Pt coming in at 1:15 (pt notified)

## 2014-04-20 NOTE — Progress Notes (Signed)
Urine culture added

## 2014-04-20 NOTE — Progress Notes (Signed)
   Subjective:    Patient ID: Yolanda Perkins, female    DOB: 1950/05/02, 64 y.o.   MRN: [id number]  HPI 64 year old female with past history of FCD who comes in today as a work in with concerns regarding a possible urinary tract infection.  States symptoms started a couple of days ago.  Has noticed increased urinary urgency.  Some discomfort with end urination.  no abdominal pain.  No hematuria.  Has taken AZO.  No fever.  minimal back pain.  No vaginal symptoms reported.     Past Medical History  Diagnosis Date  . Chicken pox   . Depression   . Migraines   . Urinary tract bacterial infections     Current Outpatient Prescriptions on File Prior to Visit  Medication Sig Dispense Refill  . buPROPion (WELLBUTRIN SR) 150 MG 12 hr tablet Take 1 tablet (150 mg total) by mouth daily. 90 tablet 2  . Calcium Carbonate-Vit D-Min 600-400 MG-UNIT TABS Take 1 tablet by mouth 2 (two) times daily.    . Cholecalciferol (VITAMIN D3) 1000 UNITS CAPS Take 1 capsule by mouth daily.    Marland Kitchen estradiol (ESTRACE) 0.1 MG/GM vaginal cream Use as directed. 42.5 g 2  . meloxicam (MOBIC) 7.5 MG tablet Take 1 tablet (7.5 mg total) by mouth daily. With meals 90 tablet 1  . metaxalone (SKELAXIN) 800 MG tablet 1/2 - 1 tablet q pm prn. 20 tablet 0  . Misc Natural Products (GLUCOSAMINE-CHONDROITIN SULF) TABS Take 1 tablet by mouth daily.    . Multiple Vitamin (MULTIVITAMIN) tablet Take 1 tablet by mouth daily.    Marland Kitchen omeprazole (PRILOSEC) 20 MG capsule Take 1 capsule (20 mg total) by mouth daily. 90 capsule 2  . pyridoxine (B-6) 100 MG tablet Take 100 mg by mouth daily.    . raloxifene (EVISTA) 60 MG tablet Take 1 tablet (60 mg total) by mouth daily. 90 tablet 3  . SUMAtriptan (IMITREX) 50 MG tablet Take 1/2 tablet my mouth as  directed as needed.     No current facility-administered medications on file prior to visit.    Review of Systems Some increased urinary urgency.  Discomfort with end urination.  No hematuria.  No  nausea or vomiting.  No abdominal pain or cramping.  No bowel change, such as diarrhea.  No vaginal problems reported.  Taking AZO.  Increased stress with her work.  Feels she is handling things relatively well.        Objective:   Physical Exam Filed Vitals:   04/20/14 1322  BP: 129/76  Pulse: 89  Temp: 98.2 F (90.72 C)   64 year old female in no acute distress.  HEART:  Appears to be regular. LUNGS:  No crackles or wheezing audible.  Respirations even and unlabored.  ABDOMEN:  Soft, nontender.  Bowel sounds present and normal.  No audible abdominal bruit.  BACK:  Non tender.  No CVA tenderness.            Assessment & Plan:  1. Urinary frequency - POCT Urinalysis Dipstick  2. Acute cystitis without hematuria Urine dip with trace leukocytes and small blood.  Given symptoms and urine dip, will treat for uti.  Send culture.  Treat with cipro as directed.  Follow.   - CULTURE, URINE COMPREHENSIVE  3. Stress Increased stress.  Feels she is handling things relatively well.  Follow.

## 2014-04-22 ENCOUNTER — Encounter: Payer: Self-pay | Admitting: Internal Medicine

## 2014-04-22 LAB — CULTURE, URINE COMPREHENSIVE
Colony Count: NO GROWTH
Organism ID, Bacteria: NO GROWTH

## 2014-04-25 ENCOUNTER — Encounter: Payer: Self-pay | Admitting: Internal Medicine

## 2014-04-30 NOTE — Telephone Encounter (Signed)
I called patient & notified her of her results

## 2014-06-25 ENCOUNTER — Encounter: Payer: No Typology Code available for payment source | Admitting: Internal Medicine

## 2014-07-27 ENCOUNTER — Encounter: Payer: Self-pay | Admitting: Internal Medicine

## 2014-07-27 ENCOUNTER — Ambulatory Visit (INDEPENDENT_AMBULATORY_CARE_PROVIDER_SITE_OTHER): Payer: Medicare Other | Admitting: Internal Medicine

## 2014-07-27 VITALS — BP 120/77 | HR 62 | Temp 98.0°F | Ht 62.25 in | Wt 125.5 lb

## 2014-07-27 DIAGNOSIS — M8580 Other specified disorders of bone density and structure, unspecified site: Secondary | ICD-10-CM | POA: Diagnosis not present

## 2014-07-27 DIAGNOSIS — K5730 Diverticulosis of large intestine without perforation or abscess without bleeding: Secondary | ICD-10-CM

## 2014-07-27 DIAGNOSIS — Z658 Other specified problems related to psychosocial circumstances: Secondary | ICD-10-CM

## 2014-07-27 DIAGNOSIS — Z1239 Encounter for other screening for malignant neoplasm of breast: Secondary | ICD-10-CM

## 2014-07-27 DIAGNOSIS — Z1211 Encounter for screening for malignant neoplasm of colon: Secondary | ICD-10-CM

## 2014-07-27 DIAGNOSIS — Z0000 Encounter for general adult medical examination without abnormal findings: Secondary | ICD-10-CM

## 2014-07-27 DIAGNOSIS — F439 Reaction to severe stress, unspecified: Secondary | ICD-10-CM

## 2014-07-27 DIAGNOSIS — Z13220 Encounter for screening for lipoid disorders: Secondary | ICD-10-CM

## 2014-07-27 DIAGNOSIS — M545 Low back pain: Secondary | ICD-10-CM

## 2014-07-27 DIAGNOSIS — Z23 Encounter for immunization: Secondary | ICD-10-CM

## 2014-07-27 NOTE — Progress Notes (Signed)
Patient ID: Yolanda Perkins, female   DOB: 08-Aug-1950, 64 y.o.   MRN: [id number]   Subjective:    Patient ID: Yolanda Perkins, female    DOB: 1950-11-03, 64 y.o.   MRN: [id number]  HPI  Patient here for her physical.  She feels she is coping relatively well with stress.  Her father is in a facility now.  This has improved her stress.  She has been working a lot outside.  Some increased back pain.  Took skelaxin prn.  Better now.  Eating and drinking well.  Bowels stable.     Past Medical History  Diagnosis Date  . Chicken pox   . Depression   . Migraines   . Urinary tract bacterial infections     Current Outpatient Prescriptions on File Prior to Visit  Medication Sig Dispense Refill  . buPROPion (WELLBUTRIN SR) 150 MG 12 hr tablet Take 1 tablet (150 mg total) by mouth daily. 90 tablet 2  . Calcium Carbonate-Vit D-Min 600-400 MG-UNIT TABS Take 1 tablet by mouth 2 (two) times daily.    . Cholecalciferol (VITAMIN D3) 1000 UNITS CAPS Take 1 capsule by mouth daily.    Marland Kitchen estradiol (ESTRACE) 0.1 MG/GM vaginal cream Use as directed. 42.5 g 2  . meloxicam (MOBIC) 7.5 MG tablet Take 1 tablet (7.5 mg total) by mouth daily. With meals 90 tablet 1  . metaxalone (SKELAXIN) 800 MG tablet 1/2 - 1 tablet q pm prn. 20 tablet 0  . Misc Natural Products (GLUCOSAMINE-CHONDROITIN SULF) TABS Take 1 tablet by mouth daily.    . Multiple Vitamin (MULTIVITAMIN) tablet Take 1 tablet by mouth daily.    Marland Kitchen omeprazole (PRILOSEC) 20 MG capsule Take 1 capsule (20 mg total) by mouth daily. 90 capsule 2  . pyridoxine (B-6) 100 MG tablet Take 100 mg by mouth daily.    . raloxifene (EVISTA) 60 MG tablet Take 1 tablet (60 mg total) by mouth daily. 90 tablet 3  . SUMAtriptan (IMITREX) 50 MG tablet Take 1/2 tablet my mouth as  directed as needed.     No current facility-administered medications on file prior to visit.    Review of Systems  Constitutional: Negative for appetite change and unexpected weight change.  HENT:  Negative for congestion and sinus pressure.   Eyes: Negative for pain and visual disturbance.  Respiratory: Negative for cough, chest tightness and shortness of breath.   Cardiovascular: Negative for chest pain, palpitations and leg swelling.  Gastrointestinal: Negative for nausea, vomiting, abdominal pain and diarrhea.  Genitourinary: Negative for dysuria and difficulty urinating.  Musculoskeletal: Positive for back pain (better now. ). Negative for joint swelling.  Skin: Negative for color change and rash.  Neurological: Negative for dizziness, light-headedness and headaches.  Hematological: Negative for adenopathy. Does not bruise/bleed easily.  Psychiatric/Behavioral: Negative for dysphoric mood and agitation.       Objective:    Physical Exam  Constitutional: She is oriented to person, place, and time. She appears well-developed and well-nourished.  HENT:  Nose: Nose normal.  Mouth/Throat: Oropharynx is clear and moist.  Eyes: Right eye exhibits no discharge. Left eye exhibits no discharge. No scleral icterus.  Neck: Neck supple. No thyromegaly present.  Cardiovascular: Normal rate and regular rhythm.   Pulmonary/Chest: Breath sounds normal. No accessory muscle usage. No tachypnea. No respiratory distress. She has no decreased breath sounds. She has no wheezes. She has no rhonchi. Right breast exhibits no inverted nipple, no mass, no nipple discharge and no tenderness (no  axillary adenopathy). Left breast exhibits no inverted nipple, no mass, no nipple discharge and no tenderness (no axilarry adenopathy).  Abdominal: Soft. Bowel sounds are normal. There is no tenderness.  Musculoskeletal: She exhibits no edema or tenderness.  Lymphadenopathy:    She has no cervical adenopathy.  Neurological: She is alert and oriented to person, place, and time.  Skin: Skin is warm. No rash noted.  Psychiatric: She has a normal mood and affect. Her behavior is normal.    BP 120/77 mmHg  Pulse  62  Temp(Src) 98 F (36.7 C) (Oral)  Ht 5' 2.25" (1.581 m)  Wt 125 lb 8 oz (56.926 kg)  BMI 22.77 kg/m2  SpO2 98%  LMP 04/19/1999 Wt Readings from Last 3 Encounters:  07/27/14 125 lb 8 oz (56.926 kg)  04/20/14 124 lb 12 oz (56.586 kg)  12/21/13 126 lb 4 oz (57.267 kg)     Lab Results  Component Value Date   WBC 4.4* 06/25/2013   HGB 12.9 06/25/2013   HCT 38.6 06/25/2013   PLT 190.0 06/25/2013   GLUCOSE 87 12/21/2013   CHOL 192 06/25/2013   TRIG 80.0 06/25/2013   HDL 55.30 06/25/2013   LDLCALC 121* 06/25/2013   ALT 19 12/21/2013   AST 26 12/21/2013   NA 142 12/21/2013   K 4.5 12/21/2013   CL 104 12/21/2013   CREATININE 1.0 12/21/2013   BUN 20 12/21/2013   CO2 31 12/21/2013   TSH 1.60 06/25/2013       Assessment & Plan:   Problem List Items Addressed This Visit    Back pain    Back pain flares intermittently.  Was working out in the yard.  Flare recently.  Took skelaxin prn.  Better now.  Follow.        Diverticulosis of colon without hemorrhage    Colonoscopy 2004 - diverticulosis.    Recommended f/u in 10 years.  Overdue.  Refer to GI for colonoscopy.        Health care maintenance    Physical 07/27/14.  Due colonoscopy.  Schedule.  Mammogram 06/23/13 - Birads I.  She will schedule f/u mammogram.        Osteopenia    Continue calcium and vitamin D.  Continue weight bearing exercise.  Follow vitamin D level.  On evista.        Relevant Orders   Comprehensive metabolic panel   Vit D  25 hydroxy (rtn osteoporosis monitoring)   Stress    Doing better.  On wellbutrin.  Follow.        Relevant Orders   CBC with Differential/Platelet   TSH    Other Visit Diagnoses    Breast cancer screening    -  Primary    Relevant Orders    MM DIGITAL SCREENING BILATERAL    Colon cancer screening        Relevant Orders    Ambulatory referral to Gastroenterology    Need for prophylactic vaccination against Streptococcus pneumoniae (pneumococcus)        Relevant  Orders    Pneumococcal conjugate vaccine 13-valent (Completed)    Screening cholesterol level        Relevant Orders    Lipid panel      I spent 25 minutes with the patient and more than 50% of the time was spent in consultation regarding the above.     Einar Pheasant, MD

## 2014-07-27 NOTE — Progress Notes (Signed)
Pre visit review using our clinic review tool, if applicable. No additional management support is needed unless otherwise documented below in the visit note. 

## 2014-07-30 ENCOUNTER — Encounter: Payer: Self-pay | Admitting: Internal Medicine

## 2014-07-30 DIAGNOSIS — K5730 Diverticulosis of large intestine without perforation or abscess without bleeding: Secondary | ICD-10-CM | POA: Insufficient documentation

## 2014-07-30 DIAGNOSIS — M549 Dorsalgia, unspecified: Secondary | ICD-10-CM | POA: Insufficient documentation

## 2014-07-30 DIAGNOSIS — Z0000 Encounter for general adult medical examination without abnormal findings: Secondary | ICD-10-CM | POA: Insufficient documentation

## 2014-07-30 NOTE — Assessment & Plan Note (Signed)
Colonoscopy 2004 - diverticulosis.    Recommended f/u in 10 years.  Overdue.  Refer to GI for colonoscopy.

## 2014-07-30 NOTE — Assessment & Plan Note (Signed)
Doing better.  On wellbutrin.  Follow.

## 2014-07-30 NOTE — Assessment & Plan Note (Signed)
Continue calcium and vitamin D.  Continue weight bearing exercise.  Follow vitamin D level.  On evista.

## 2014-07-30 NOTE — Assessment & Plan Note (Signed)
Physical 07/27/14.  Due colonoscopy.  Schedule.  Mammogram 06/23/13 - Birads I.  She will schedule f/u mammogram.

## 2014-07-30 NOTE — Assessment & Plan Note (Signed)
Back pain flares intermittently.  Was working out in the yard.  Flare recently.  Took skelaxin prn.  Better now.  Follow.

## 2014-08-10 ENCOUNTER — Other Ambulatory Visit: Payer: Medicare Other

## 2014-08-16 ENCOUNTER — Other Ambulatory Visit: Payer: Self-pay | Admitting: *Deleted

## 2014-08-16 MED ORDER — SUMATRIPTAN SUCCINATE 50 MG PO TABS: ORAL_TABLET | ORAL | Status: DC

## 2014-09-01 ENCOUNTER — Other Ambulatory Visit (INDEPENDENT_AMBULATORY_CARE_PROVIDER_SITE_OTHER): Payer: Medicare Other

## 2014-09-01 DIAGNOSIS — M8580 Other specified disorders of bone density and structure, unspecified site: Secondary | ICD-10-CM | POA: Diagnosis not present

## 2014-09-01 DIAGNOSIS — Z13220 Encounter for screening for lipoid disorders: Secondary | ICD-10-CM

## 2014-09-01 DIAGNOSIS — Z658 Other specified problems related to psychosocial circumstances: Secondary | ICD-10-CM | POA: Diagnosis not present

## 2014-09-01 DIAGNOSIS — F439 Reaction to severe stress, unspecified: Secondary | ICD-10-CM

## 2014-09-01 LAB — CBC WITH DIFFERENTIAL/PLATELET
BASOS ABS: 0 10*3/uL (ref 0.0–0.1)
Basophils Relative: 0.3 % (ref 0.0–3.0)
Eosinophils Absolute: 0.2 10*3/uL (ref 0.0–0.7)
Eosinophils Relative: 3.5 % (ref 0.0–5.0)
HEMATOCRIT: 37.8 % (ref 36.0–46.0)
Hemoglobin: 12.8 g/dL (ref 12.0–15.0)
Lymphocytes Relative: 24 % (ref 12.0–46.0)
Lymphs Abs: 1.3 10*3/uL (ref 0.7–4.0)
MCHC: 33.9 g/dL (ref 30.0–36.0)
MCV: 93.4 fl (ref 78.0–100.0)
Monocytes Absolute: 0.4 10*3/uL (ref 0.1–1.0)
Monocytes Relative: 6.7 % (ref 3.0–12.0)
Neutro Abs: 3.6 10*3/uL (ref 1.4–7.7)
Neutrophils Relative %: 65.5 % (ref 43.0–77.0)
PLATELETS: 204 10*3/uL (ref 150.0–400.0)
RBC: 4.05 Mil/uL (ref 3.87–5.11)
RDW: 13.1 % (ref 11.5–15.5)
WBC: 5.5 10*3/uL (ref 4.0–10.5)

## 2014-09-01 LAB — COMPREHENSIVE METABOLIC PANEL
ALT: 16 U/L (ref 0–35)
AST: 22 U/L (ref 0–37)
Albumin: 3.9 g/dL (ref 3.5–5.2)
Alkaline Phosphatase: 59 U/L (ref 39–117)
BUN: 12 mg/dL (ref 6–23)
CO2: 32 meq/L (ref 19–32)
Calcium: 9 mg/dL (ref 8.4–10.5)
Chloride: 105 mEq/L (ref 96–112)
Creatinine, Ser: 1.03 mg/dL (ref 0.40–1.20)
GFR: 56.94 mL/min — ABNORMAL LOW (ref >60.00)
GLUCOSE: 91 mg/dL (ref 70–99)
Potassium: 4.4 mEq/L (ref 3.5–5.1)
SODIUM: 140 meq/L (ref 135–145)
Total Bilirubin: 0.7 mg/dL (ref 0.2–1.2)
Total Protein: 6.3 g/dL (ref 6.0–8.3)

## 2014-09-01 LAB — LIPID PANEL
CHOLESTEROL: 166 mg/dL (ref 0–200)
HDL: 53.2 mg/dL (ref >39.00)
LDL Cholesterol: 93 mg/dL (ref 0–99)
NonHDL: 112.8
TRIGLYCERIDES: 97 mg/dL (ref 0.0–149.0)
Total CHOL/HDL Ratio: 3
VLDL: 19.4 mg/dL (ref 0.0–40.0)

## 2014-09-01 LAB — VITAMIN D 25 HYDROXY (VIT D DEFICIENCY, FRACTURES): VITD: 54.54 ng/mL (ref 30.00–100.00)

## 2014-09-01 LAB — TSH: TSH: 1.96 u[IU]/mL (ref 0.35–4.50)

## 2014-09-02 ENCOUNTER — Encounter: Payer: Self-pay | Admitting: Internal Medicine

## 2014-09-03 NOTE — Telephone Encounter (Signed)
Unread mychart message mailed to patient 

## 2014-09-20 ENCOUNTER — Other Ambulatory Visit: Payer: Self-pay | Admitting: *Deleted

## 2014-09-20 NOTE — Telephone Encounter (Signed)
How often is she taking the medication now?  Would try to minimize the amount taking.  Just let me know and can send in rx.

## 2014-09-20 NOTE — Telephone Encounter (Signed)
Ok refill? 

## 2014-09-21 NOTE — Telephone Encounter (Signed)
Left message for pt to return my call, requesting how often she is taking Meloxicam

## 2014-09-22 NOTE — Telephone Encounter (Signed)
Left message for pt to return my call.

## 2014-09-27 NOTE — Telephone Encounter (Signed)
Left message for pt to return my call.

## 2014-09-28 MED ORDER — MELOXICAM 7.5 MG PO TABS: 7.5000 mg | ORAL_TABLET | Freq: Every day | ORAL | Status: DC | PRN

## 2014-09-28 NOTE — Telephone Encounter (Signed)
Pt has not returned any of my calls regarding Meloxicam. Do you want to refill or deny?

## 2014-09-28 NOTE — Telephone Encounter (Signed)
ok'd refill meloxicam #90 with no refills.

## 2014-10-12 ENCOUNTER — Ambulatory Visit
Admission: RE | Admit: 2014-10-12 | Discharge: 2014-10-12 | Disposition: A | Discharge status: Home / Self Care | Payer: Medicare Other | Source: Ambulatory Visit | Attending: Internal Medicine | Admitting: Internal Medicine

## 2014-10-12 ENCOUNTER — Other Ambulatory Visit: Payer: Self-pay | Admitting: Internal Medicine

## 2014-10-12 DIAGNOSIS — Z1231 Encounter for screening mammogram for malignant neoplasm of breast: Secondary | ICD-10-CM | POA: Diagnosis not present

## 2014-10-12 DIAGNOSIS — Z1239 Encounter for other screening for malignant neoplasm of breast: Secondary | ICD-10-CM

## 2014-10-20 LAB — HM COLONOSCOPY

## 2014-10-25 ENCOUNTER — Telehealth: Payer: Self-pay | Admitting: Internal Medicine

## 2014-10-25 NOTE — Telephone Encounter (Signed)
Pt called to advise that she is still taking the meloxicam and wanted to get a refill on rx. Please advise pt/msn

## 2014-10-25 NOTE — Telephone Encounter (Signed)
Need to make sure pt is aware that rx sent in on 09/20/14 for 90 tablets.  If she did not get the rx, let me know.

## 2014-10-25 NOTE — Telephone Encounter (Signed)
Refilled on 6/20 #90 with no refills per you.  Please advise a refill now?

## 2014-10-26 NOTE — Telephone Encounter (Signed)
Attempted to call the patient, left a message.

## 2014-10-26 NOTE — Telephone Encounter (Signed)
Noted.  I do not mind refilling if there was an error.  Just let me know if I need to do anything more.

## 2014-10-27 NOTE — Telephone Encounter (Signed)
Spoke with patient, she is going to follow up with [facility] to see if the prescription is there.  She will return a call to me if it is not.

## 2014-10-29 ENCOUNTER — Encounter: Payer: Self-pay | Admitting: Internal Medicine

## 2014-12-20 ENCOUNTER — Other Ambulatory Visit: Payer: Self-pay

## 2014-12-20 MED ORDER — BUPROPION HCL ER (SR) 150 MG PO TB12: 150.0000 mg | ORAL_TABLET | Freq: Every day | ORAL | Status: DC

## 2014-12-20 MED ORDER — OMEPRAZOLE 20 MG PO CPDR: 20.0000 mg | DELAYED_RELEASE_CAPSULE | Freq: Every day | ORAL | Status: DC

## 2015-01-25 ENCOUNTER — Ambulatory Visit (INDEPENDENT_AMBULATORY_CARE_PROVIDER_SITE_OTHER): Payer: Medicare Other | Admitting: Internal Medicine

## 2015-01-25 ENCOUNTER — Encounter: Payer: Self-pay | Admitting: Internal Medicine

## 2015-01-25 VITALS — BP 110/80 | HR 74 | Temp 98.1°F | Resp 18 | Ht 62.25 in | Wt 123.0 lb

## 2015-01-25 DIAGNOSIS — M25561 Pain in right knee: Secondary | ICD-10-CM | POA: Diagnosis not present

## 2015-01-25 DIAGNOSIS — Z13220 Encounter for screening for lipoid disorders: Secondary | ICD-10-CM

## 2015-01-25 DIAGNOSIS — M25562 Pain in left knee: Secondary | ICD-10-CM

## 2015-01-25 DIAGNOSIS — K5730 Diverticulosis of large intestine without perforation or abscess without bleeding: Secondary | ICD-10-CM

## 2015-01-25 DIAGNOSIS — Z23 Encounter for immunization: Secondary | ICD-10-CM | POA: Diagnosis not present

## 2015-01-25 DIAGNOSIS — Z658 Other specified problems related to psychosocial circumstances: Secondary | ICD-10-CM

## 2015-01-25 DIAGNOSIS — F439 Reaction to severe stress, unspecified: Secondary | ICD-10-CM

## 2015-01-25 MED ORDER — SUMATRIPTAN SUCCINATE 50 MG PO TABS: ORAL_TABLET | ORAL | Status: DC

## 2015-01-25 NOTE — Patient Instructions (Signed)

## 2015-01-25 NOTE — Progress Notes (Signed)
Pre-visit discussion using our clinic review tool. No additional management support is needed unless otherwise documented below in the visit note.  

## 2015-01-25 NOTE — Progress Notes (Signed)
Patient ID: Yolanda Perkins, female   DOB: [birth year], 65 y.o.   MRN: [id number]   Subjective:    Patient ID: Yolanda Perkins, female    DOB: 13-Aug-1950, 64 y.o.   MRN: [id number]  HPI  Patient with past history of depression who comes in today to follow up on this as well as for a scheduled follow up.  She tries to stay active.  No cardiac symptoms with increased activity or exertion.  She does report some knee discomfort.  Both knees.  Occurs if she sits a lot or walks a lot.  No other significant joint issues.  No abdominal pain or cramping.  No bowel change.  No increased cough, congestion or sob.     Past Medical History  Diagnosis Date  . Chicken pox   . Depression   . Migraines   . Urinary tract bacterial infections    Past Surgical History  Procedure Laterality Date  . Tonsillectomy and adenoidectomy  1954  . Hernia repair  1962   Family History  Problem Relation Age of Onset  . Arthritis Mother     rheumatoid -which is in remission  . Osteoporosis Mother   . Transient ischemic attack Mother   . Congestive Heart Failure Mother   . Diverticulitis Mother   . Heart disease Father     s/p bypass surgery  . Breast cancer Maternal Aunt   . Uterine cancer Maternal Aunt   . Breast cancer Maternal Grandmother   . Uterine cancer Maternal Grandmother    Social History   Social History  . Marital Status: Married    Spouse Name: N/A  . Number of Children: N/A  . Years of Education: N/A   Social History Main Topics  . Smoking status: Never Smoker   . Smokeless tobacco: Never Used  . Alcohol Use: No  . Drug Use: No  . Sexual Activity: Not Asked   Other Topics Concern  . None   Social History Narrative   She is married and has 2 daughters.    Outpatient Encounter Prescriptions as of 01/25/2015  Medication Sig  . buPROPion (WELLBUTRIN SR) 150 MG 12 hr tablet Take 1 tablet (150 mg total) by mouth daily.  . Calcium Carbonate-Vit D-Min 600-400 MG-UNIT TABS Take 1 tablet by  mouth 2 (two) times daily.  . Cholecalciferol (VITAMIN D3) 1000 UNITS CAPS Take 1 capsule by mouth daily.  Marland Kitchen estradiol (ESTRACE) 0.1 MG/GM vaginal cream Use as directed.  . meloxicam (MOBIC) 7.5 MG tablet Take 1 tablet (7.5 mg total) by mouth daily as needed for pain. With meals  . metaxalone (SKELAXIN) 800 MG tablet 1/2 - 1 tablet q pm prn.  . Misc Natural Products (GLUCOSAMINE-CHONDROITIN SULF) TABS Take 1 tablet by mouth daily.  . Multiple Vitamin (MULTIVITAMIN) tablet Take 1 tablet by mouth daily.  Marland Kitchen omeprazole (PRILOSEC) 20 MG capsule Take 1 capsule (20 mg total) by mouth daily.  Marland Kitchen pyridoxine (B-6) 100 MG tablet Take 100 mg by mouth daily.  . raloxifene (EVISTA) 60 MG tablet Take 1 tablet (60 mg total) by mouth daily.  . SUMAtriptan (IMITREX) 50 MG tablet Take 1/2 tablet my mouth as  directed as needed.  . [DISCONTINUED] SUMAtriptan (IMITREX) 50 MG tablet Take 1/2 tablet my mouth as  directed as needed.   No facility-administered encounter medications on file as of 01/25/2015.    Review of Systems  Constitutional: Negative for appetite change and unexpected weight change.  HENT: Negative for congestion and  sinus pressure.   Respiratory: Negative for cough, chest tightness and shortness of breath.   Cardiovascular: Negative for chest pain, palpitations and leg swelling.  Gastrointestinal: Negative for nausea, vomiting, abdominal pain and diarrhea.  Genitourinary: Negative for dysuria and difficulty urinating.  Musculoskeletal: Negative for back pain.       Knee pain as outlined.    Skin: Negative for color change and rash.  Neurological: Negative for dizziness, light-headedness and headaches.  Psychiatric/Behavioral: Negative for dysphoric mood and agitation.       Objective:    Physical Exam  Constitutional: She appears well-developed and well-nourished. No distress.  HENT:  Nose: Nose normal.  Mouth/Throat: Oropharynx is clear and moist.  Eyes: Conjunctivae are normal.  Right eye exhibits no discharge. Left eye exhibits no discharge.  Neck: Neck supple. No thyromegaly present.  Cardiovascular: Normal rate and regular rhythm.   Pulmonary/Chest: Breath sounds normal. No respiratory distress. She has no wheezes.  Abdominal: Soft. Bowel sounds are normal. There is no tenderness.  Musculoskeletal: She exhibits no edema or tenderness.  Lymphadenopathy:    She has no cervical adenopathy.  Skin: No rash noted. No erythema.  Psychiatric: She has a normal mood and affect. Her behavior is normal.    BP 110/80 mmHg  Pulse 74  Temp(Src) 98.1 F (36.7 C) (Oral)  Resp 18  Ht 5' 2.25" (1.581 m)  Wt 123 lb (55.792 kg)  BMI 22.32 kg/m2  SpO2 97%  LMP 04/19/1999 Wt Readings from Last 3 Encounters:  01/25/15 123 lb (55.792 kg)  07/27/14 125 lb 8 oz (56.926 kg)  04/20/14 124 lb 12 oz (56.586 kg)     Lab Results  Component Value Date   WBC 5.5 09/01/2014   HGB 12.8 09/01/2014   HCT 37.8 09/01/2014   PLT 204.0 09/01/2014   GLUCOSE 91 09/01/2014   CHOL 166 09/01/2014   TRIG 97.0 09/01/2014   HDL 53.20 09/01/2014   LDLCALC 93 09/01/2014   ALT 16 09/01/2014   AST 22 09/01/2014   NA 140 09/01/2014   K 4.4 09/01/2014   CL 105 09/01/2014   CREATININE 1.03 09/01/2014   BUN 12 09/01/2014   CO2 32 09/01/2014   TSH 1.96 09/01/2014    Mm Screening Breast Tomo Bilateral  10/12/2014  CLINICAL DATA:  Screening. EXAM: DIGITAL SCREENING BILATERAL MAMMOGRAM WITH 3D TOMO WITH CAD COMPARISON:  Previous exam(s). ACR Breast Density Category c: The breast tissue is heterogeneously dense, which may obscure small masses. FINDINGS: There are no findings suspicious for malignancy. Images were processed with CAD. IMPRESSION: No mammographic evidence of malignancy. A result letter of this screening mammogram will be mailed directly to the patient. RECOMMENDATION: Screening mammogram in one year. (Code:SM-B-01Y) BI-RADS CATEGORY  1: Negative. Electronically Signed   By: Everlean Alstrom M.D.   On: 10/12/2014 09:29       Assessment & Plan:   Problem List Items Addressed This Visit    Diverticulosis of colon without hemorrhage    Colonoscopy 10/20/14 - diverticulosis.  Recommended f/u colonoscopy in 10 years.        Knee pain    Bilateral knee pain.  Persistent.  Tylenol if needed.  Has mobic.  Check xray.       Stress    Increased stress with her job.  On wellbutrin.  Does not feel she needs any further intervention at this time.  Follow.         Other Visit Diagnoses    Encounter for immunization    -  Primary    Knee pain, bilateral        Relevant Orders    DG Knee 1-2 Views Left    DG Knee 1-2 Views Right    Comprehensive metabolic panel    Screening cholesterol level        Relevant Orders    Lipid panel        Einar Pheasant, MD

## 2015-01-30 ENCOUNTER — Encounter: Payer: Self-pay | Admitting: Internal Medicine

## 2015-01-30 DIAGNOSIS — M25569 Pain in unspecified knee: Secondary | ICD-10-CM | POA: Insufficient documentation

## 2015-01-30 NOTE — Assessment & Plan Note (Signed)
Increased stress with her job.  On wellbutrin.  Does not feel she needs any further intervention at this time.  Follow.

## 2015-01-30 NOTE — Assessment & Plan Note (Signed)
Bilateral knee pain.  Persistent.  Tylenol if needed.  Has mobic.  Check xray.

## 2015-01-30 NOTE — Assessment & Plan Note (Signed)
Colonoscopy 10/20/14 - diverticulosis.  Recommended f/u colonoscopy in 10 years.

## 2015-01-31 ENCOUNTER — Ambulatory Visit
Admission: RE | Admit: 2015-01-31 | Discharge: 2015-01-31 | Disposition: A | Discharge status: Home / Self Care | Payer: Medicare Other | Source: Ambulatory Visit | Attending: Internal Medicine | Admitting: Internal Medicine

## 2015-01-31 DIAGNOSIS — M170 Bilateral primary osteoarthritis of knee: Secondary | ICD-10-CM | POA: Diagnosis not present

## 2015-01-31 DIAGNOSIS — M25561 Pain in right knee: Secondary | ICD-10-CM | POA: Diagnosis present

## 2015-01-31 DIAGNOSIS — M25562 Pain in left knee: Principal | ICD-10-CM

## 2015-01-31 DIAGNOSIS — M25461 Effusion, right knee: Secondary | ICD-10-CM | POA: Insufficient documentation

## 2015-01-31 DIAGNOSIS — M25761 Osteophyte, right knee: Secondary | ICD-10-CM | POA: Diagnosis not present

## 2015-02-02 ENCOUNTER — Encounter: Payer: Self-pay | Admitting: *Deleted

## 2015-02-03 ENCOUNTER — Telehealth: Payer: Self-pay | Admitting: *Deleted

## 2015-02-03 DIAGNOSIS — M25561 Pain in right knee: Secondary | ICD-10-CM

## 2015-02-03 DIAGNOSIS — M25562 Pain in left knee: Principal | ICD-10-CM

## 2015-02-03 NOTE — Telephone Encounter (Signed)
Called patient,advised of xray result and need to see Ortho for eval.  Patient agrees but wants it in [place].  Has seen [facility] before but doesn't want to there.

## 2015-02-03 NOTE — Telephone Encounter (Signed)
Order placed for ortho referral.   

## 2015-02-03 NOTE — Telephone Encounter (Signed)
Patient requested a call back with results for her Xray.-thanks

## 2015-05-27 ENCOUNTER — Other Ambulatory Visit: Payer: Self-pay | Admitting: Internal Medicine

## 2015-05-27 NOTE — Telephone Encounter (Signed)
Medication filled to pharmacy as requested.   

## 2015-05-30 DIAGNOSIS — M7550 Bursitis of unspecified shoulder: Secondary | ICD-10-CM | POA: Diagnosis not present

## 2015-05-30 DIAGNOSIS — M9903 Segmental and somatic dysfunction of lumbar region: Secondary | ICD-10-CM | POA: Diagnosis not present

## 2015-05-30 DIAGNOSIS — M609 Myositis, unspecified: Secondary | ICD-10-CM | POA: Diagnosis not present

## 2015-05-30 DIAGNOSIS — M9901 Segmental and somatic dysfunction of cervical region: Secondary | ICD-10-CM | POA: Diagnosis not present

## 2015-06-15 DIAGNOSIS — M7550 Bursitis of unspecified shoulder: Secondary | ICD-10-CM | POA: Diagnosis not present

## 2015-06-15 DIAGNOSIS — M9903 Segmental and somatic dysfunction of lumbar region: Secondary | ICD-10-CM | POA: Diagnosis not present

## 2015-06-15 DIAGNOSIS — M9901 Segmental and somatic dysfunction of cervical region: Secondary | ICD-10-CM | POA: Diagnosis not present

## 2015-06-15 DIAGNOSIS — M609 Myositis, unspecified: Secondary | ICD-10-CM | POA: Diagnosis not present

## 2015-06-27 DIAGNOSIS — M9903 Segmental and somatic dysfunction of lumbar region: Secondary | ICD-10-CM | POA: Diagnosis not present

## 2015-06-27 DIAGNOSIS — M9901 Segmental and somatic dysfunction of cervical region: Secondary | ICD-10-CM | POA: Diagnosis not present

## 2015-06-27 DIAGNOSIS — M609 Myositis, unspecified: Secondary | ICD-10-CM | POA: Diagnosis not present

## 2015-06-27 DIAGNOSIS — M7550 Bursitis of unspecified shoulder: Secondary | ICD-10-CM | POA: Diagnosis not present

## 2015-07-25 ENCOUNTER — Other Ambulatory Visit (INDEPENDENT_AMBULATORY_CARE_PROVIDER_SITE_OTHER): Payer: Medicare HMO

## 2015-07-25 DIAGNOSIS — Z13220 Encounter for screening for lipoid disorders: Secondary | ICD-10-CM | POA: Diagnosis not present

## 2015-07-25 DIAGNOSIS — M25562 Pain in left knee: Secondary | ICD-10-CM

## 2015-07-25 DIAGNOSIS — M25561 Pain in right knee: Secondary | ICD-10-CM | POA: Diagnosis not present

## 2015-07-25 LAB — COMPREHENSIVE METABOLIC PANEL
ALK PHOS: 65 U/L (ref 39–117)
ALT: 18 U/L (ref 0–35)
AST: 22 U/L (ref 0–37)
Albumin: 3.9 g/dL (ref 3.5–5.2)
BUN: 24 mg/dL — AB (ref 6–23)
CO2: 31 meq/L (ref 19–32)
Calcium: 9.1 mg/dL (ref 8.4–10.5)
Chloride: 105 mEq/L (ref 96–112)
Creatinine, Ser: 0.95 mg/dL (ref 0.40–1.20)
GFR: 62.34 mL/min (ref >60.00)
GLUCOSE: 94 mg/dL (ref 70–99)
POTASSIUM: 4.2 meq/L (ref 3.5–5.1)
SODIUM: 140 meq/L (ref 135–145)
TOTAL PROTEIN: 6.3 g/dL (ref 6.0–8.3)
Total Bilirubin: 0.7 mg/dL (ref 0.2–1.2)

## 2015-07-25 LAB — LIPID PANEL
CHOL/HDL RATIO: 3
Cholesterol: 175 mg/dL (ref 0–200)
HDL: 52.4 mg/dL (ref >39.00)
LDL CALC: 103 mg/dL — AB (ref 0–99)
NONHDL: 122.22
Triglycerides: 95 mg/dL (ref 0.0–149.0)
VLDL: 19 mg/dL (ref 0.0–40.0)

## 2015-07-26 ENCOUNTER — Encounter: Payer: Self-pay | Admitting: Internal Medicine

## 2015-07-29 ENCOUNTER — Encounter: Payer: Medicare Other | Admitting: Internal Medicine

## 2015-08-01 NOTE — Telephone Encounter (Signed)
Unread mychart message mailed to patient 

## 2015-08-09 ENCOUNTER — Telehealth: Payer: Self-pay

## 2015-08-09 NOTE — Telephone Encounter (Signed)
Patient is on my [employer] list for 2017. Patient has an appt scheduled for 10/2015 with PCP

## 2015-09-15 ENCOUNTER — Other Ambulatory Visit: Payer: Self-pay | Admitting: Internal Medicine

## 2015-09-15 DIAGNOSIS — Z1231 Encounter for screening mammogram for malignant neoplasm of breast: Secondary | ICD-10-CM

## 2015-09-26 ENCOUNTER — Other Ambulatory Visit: Payer: Self-pay | Admitting: Internal Medicine

## 2015-10-07 ENCOUNTER — Encounter: Payer: Medicare HMO | Admitting: Internal Medicine

## 2015-10-07 DIAGNOSIS — M270 Developmental disorders of jaws: Secondary | ICD-10-CM | POA: Diagnosis not present

## 2015-10-07 DIAGNOSIS — M278 Other specified diseases of jaws: Secondary | ICD-10-CM | POA: Diagnosis not present

## 2015-10-10 ENCOUNTER — Ambulatory Visit (INDEPENDENT_AMBULATORY_CARE_PROVIDER_SITE_OTHER): Payer: Medicare HMO | Admitting: Internal Medicine

## 2015-10-10 ENCOUNTER — Encounter: Payer: Self-pay | Admitting: Internal Medicine

## 2015-10-10 VITALS — BP 118/78 | HR 102 | Temp 98.1°F | Resp 18 | Ht 62.25 in | Wt 112.8 lb

## 2015-10-10 DIAGNOSIS — Z0000 Encounter for general adult medical examination without abnormal findings: Secondary | ICD-10-CM

## 2015-10-10 DIAGNOSIS — Z658 Other specified problems related to psychosocial circumstances: Secondary | ICD-10-CM | POA: Diagnosis not present

## 2015-10-10 DIAGNOSIS — K5730 Diverticulosis of large intestine without perforation or abscess without bleeding: Secondary | ICD-10-CM | POA: Diagnosis not present

## 2015-10-10 DIAGNOSIS — F439 Reaction to severe stress, unspecified: Secondary | ICD-10-CM

## 2015-10-10 NOTE — Assessment & Plan Note (Addendum)
Physical today 10/10/15.  Mammogram 10/14/15 - Birads I.  Colonoscopy 10/20/14 as outlined.  Recommended f/u colonoscopy in 10 years.

## 2015-10-10 NOTE — Progress Notes (Signed)
Patient ID: MCKINZY MINELLI, female   DOB: 1951/03/04, 65 y.o.   MRN: [id number]   Subjective:    Patient ID: [name], female    DOB: 1950-08-17, 65 y.o.   MRN: [id number]  HPI  Patient here for her physical exam.  She has been under increased stress recently.  Husband with prostate cancer.  Has lost weight.  He is doing better now.  She is eating better.  No nausea or vomiting.  Bowels stable.  No abdominal pain or cramping.  She has also had mouth surgery.   This is affecting her eating.     Past Medical History  Diagnosis Date  . Chicken pox   . Depression   . Migraines   . Urinary tract bacterial infections    Past Surgical History  Procedure Laterality Date  . Tonsillectomy and adenoidectomy  1954  . Hernia repair  1962   Family History  Problem Relation Age of Onset  . Arthritis Mother     rheumatoid -which is in remission  . Osteoporosis Mother   . Transient ischemic attack Mother   . Congestive Heart Failure Mother   . Diverticulitis Mother   . Ovarian cancer Mother   . Heart disease Father     s/p bypass surgery  . Breast cancer Maternal Aunt   . Uterine cancer Maternal Aunt   . Uterine cancer Maternal Grandmother   . Breast cancer Maternal Aunt    Social History   Social History  . Marital Status: Married    Spouse Name: N/A  . Number of Children: N/A  . Years of Education: N/A   Social History Main Topics  . Smoking status: Never Smoker   . Smokeless tobacco: Never Used  . Alcohol Use: No  . Drug Use: No  . Sexual Activity: Not Asked   Other Topics Concern  . None   Social History Narrative   She is married and has 2 daughters.    Outpatient Encounter Prescriptions as of 10/10/2015  Medication Sig  . buPROPion (WELLBUTRIN SR) 150 MG 12 hr tablet Take 1 tablet (150 mg total) by mouth daily.  . Calcium Carbonate-Vit D-Min 600-400 MG-UNIT TABS Take 1 tablet by mouth 2 (two) times daily.  . Cholecalciferol (VITAMIN D3) 1000 UNITS CAPS Take 1  capsule by mouth daily.  Marland Kitchen estradiol (ESTRACE) 0.1 MG/GM vaginal cream Use as directed.  . meloxicam (MOBIC) 7.5 MG tablet Take 1 tablet (7.5 mg total) by mouth daily as needed for pain. With meals  . metaxalone (SKELAXIN) 800 MG tablet 1/2 - 1 tablet q pm prn.  . Misc Natural Products (GLUCOSAMINE-CHONDROITIN SULF) TABS Take 1 tablet by mouth daily.  . Multiple Vitamin (MULTIVITAMIN) tablet Take 1 tablet by mouth daily.  Marland Kitchen omeprazole (PRILOSEC) 20 MG capsule Take 1 capsule (20 mg total) by mouth daily.  Marland Kitchen pyridoxine (B-6) 100 MG tablet Take 100 mg by mouth daily.  . raloxifene (EVISTA) 60 MG tablet Take 1 tablet (60 mg total) by mouth daily.  . SUMAtriptan (IMITREX) 50 MG tablet Take 1/2 tablet my mouth as  directed as needed.   No facility-administered encounter medications on file as of 10/10/2015.    Review of Systems  Constitutional: Negative for unexpected weight change.       She has lost weight.  She is eating.    HENT: Negative for congestion, sinus pressure and sore throat.   Eyes: Negative for pain and visual disturbance.  Respiratory: Negative for cough, chest  tightness and shortness of breath.   Cardiovascular: Negative for chest pain, palpitations and leg swelling.  Gastrointestinal: Negative for nausea, vomiting, abdominal pain and diarrhea.  Genitourinary: Negative for dysuria and difficulty urinating.  Musculoskeletal: Negative for back pain and joint swelling.  Skin: Negative for color change and rash.  Neurological: Negative for dizziness, light-headedness and headaches.  Hematological: Negative for adenopathy. Does not bruise/bleed easily.  Psychiatric/Behavioral: Negative for dysphoric mood and agitation.       Objective:    Physical Exam  Constitutional: She is oriented to person, place, and time. She appears well-developed and well-nourished. No distress.  HENT:  Nose: Nose normal.  Mouth/Throat: Oropharynx is clear and moist.  Eyes: Right eye exhibits no  discharge. Left eye exhibits no discharge. No scleral icterus.  Neck: Neck supple. No thyromegaly present.  Cardiovascular: Normal rate and regular rhythm.   Pulmonary/Chest: Breath sounds normal. No accessory muscle usage. No tachypnea. No respiratory distress. She has no decreased breath sounds. She has no wheezes. She has no rhonchi. Right breast exhibits no inverted nipple, no mass, no nipple discharge and no tenderness (no axillary adenopathy). Left breast exhibits no inverted nipple, no mass, no nipple discharge and no tenderness (no axilarry adenopathy).  Abdominal: Soft. Bowel sounds are normal. There is no tenderness.  Musculoskeletal: She exhibits no edema or tenderness.  Lymphadenopathy:    She has no cervical adenopathy.  Neurological: She is alert and oriented to person, place, and time.  Skin: Skin is warm. No rash noted. No erythema.  Psychiatric: She has a normal mood and affect. Her behavior is normal.    BP 118/78 mmHg  Pulse 102  Temp(Src) 98.1 F (36.7 C) (Oral)  Resp 18  Ht 5' 2.25" (1.581 m)  Wt 112 lb 12 oz (51.143 kg)  BMI 20.46 kg/m2  SpO2 98%  LMP 04/19/1999 Wt Readings from Last 3 Encounters:  10/10/15 112 lb 12 oz (51.143 kg)  01/25/15 123 lb (55.792 kg)  07/27/14 125 lb 8 oz (56.926 kg)     Lab Results  Component Value Date   WBC 5.5 09/01/2014   HGB 12.8 09/01/2014   HCT 37.8 09/01/2014   PLT 204.0 09/01/2014   GLUCOSE 94 07/25/2015   CHOL 175 07/25/2015   TRIG 95.0 07/25/2015   HDL 52.40 07/25/2015   LDLCALC 103* 07/25/2015   ALT 18 07/25/2015   AST 22 07/25/2015   NA 140 07/25/2015   K 4.2 07/25/2015   CL 105 07/25/2015   CREATININE 0.95 07/25/2015   BUN 24* 07/25/2015   CO2 31 07/25/2015   TSH 1.96 09/01/2014    Dg Knee 1-2 Views Left  02/01/2015  CLINICAL DATA:  65 year old female with bilateral knee pain getting worse over the past few months. No recent injury. Initial encounter. EXAM: LEFT KNEE - 1-2 VIEW COMPARISON:  None.  FINDINGS: Left knee two views: Marked medial tibiofemoral joint space narrowing with osteophyte formation. Mild subchondral sclerosis. Slight shift of the femur medially. Moderate patellofemoral joint degenerative changes with spur formation. Tiny suprapatellar joint effusion. No fracture or dislocation. IMPRESSION: Marked medial tibiofemoral joint space narrowing with osteophyte formation. Slight shift of the femur medially. Moderate patellofemoral joint degenerative changes with spur formation. Tiny suprapatellar joint effusion. Electronically Signed   By: Genia Del M.D.   On: 02/01/2015 06:48   Dg Knee 1-2 Views Right  02/01/2015  CLINICAL DATA:  66 year old female with bilateral knee pain getting worse over the past few months. No recent injury. Initial encounter.  EXAM: RIGHT KNEE - 1-2 VIEW COMPARISON:  None. FINDINGS: Right knee two views: Moderate medial tibial femoral joint space narrowing with osteophyte. Slight shift of femur medially. Mild patellofemoral joint degenerative changes. Small loose bodies anterior central aspect of the knee joint suspected. Small joint effusion. No fracture or dislocation. IMPRESSION: Moderate medial tibial femoral joint space narrowing with osteophyte. Slight shift of femur medially. Mild patellofemoral joint degenerative changes. Small loose bodies anterior central aspect of the knee joint suspected. Small joint effusion. Electronically Signed   By: Genia Del M.D.   On: 02/01/2015 06:46       Assessment & Plan:   Problem List Items Addressed This Visit    Diverticulosis of colon without hemorrhage    Colonoscopy 10/20/14 as outlined.  Recommended f/u colonoscopy in 10 years.        Health care maintenance - Primary    Physical today 10/10/15.  Mammogram 10/14/15 - Birads I.  Colonoscopy 10/20/14 as outlined.  Recommended f/u colonoscopy in 10 years.        Stress    Increased stress as outlined.  Discussed with her today.  She feels she is handling  things relatively well.  Does not feel needs anything more at this time.  Follow.  Notify me if feels needs anything more at this time.            Einar Pheasant, MD

## 2015-10-10 NOTE — Progress Notes (Signed)
Pre-visit discussion using our clinic review tool. No additional management support is needed unless otherwise documented below in the visit note.  

## 2015-10-13 ENCOUNTER — Ambulatory Visit
Admission: RE | Admit: 2015-10-13 | Discharge: 2015-10-13 | Disposition: A | Discharge status: Home / Self Care | Payer: Medicare HMO | Source: Ambulatory Visit | Attending: Internal Medicine | Admitting: Internal Medicine

## 2015-10-13 ENCOUNTER — Other Ambulatory Visit: Payer: Self-pay | Admitting: Internal Medicine

## 2015-10-13 DIAGNOSIS — Z1231 Encounter for screening mammogram for malignant neoplasm of breast: Secondary | ICD-10-CM | POA: Insufficient documentation

## 2015-10-16 ENCOUNTER — Encounter: Payer: Self-pay | Admitting: Internal Medicine

## 2015-10-17 NOTE — Assessment & Plan Note (Signed)
Colonoscopy 10/20/14 as outlined.  Recommended f/u colonoscopy in 10 years.

## 2015-10-17 NOTE — Assessment & Plan Note (Signed)
Increased stress as outlined.  Discussed with her today.  She feels she is handling things relatively well.  Does not feel needs anything more at this time.  Follow.  Notify me if feels needs anything more at this time.

## 2015-12-23 ENCOUNTER — Ambulatory Visit (INDEPENDENT_AMBULATORY_CARE_PROVIDER_SITE_OTHER): Payer: Medicare HMO | Admitting: Internal Medicine

## 2015-12-23 ENCOUNTER — Encounter: Payer: Self-pay | Admitting: Internal Medicine

## 2015-12-23 VITALS — BP 112/76 | HR 88 | Temp 98.2°F | Ht 62.0 in | Wt 117.2 lb

## 2015-12-23 DIAGNOSIS — Z658 Other specified problems related to psychosocial circumstances: Secondary | ICD-10-CM

## 2015-12-23 DIAGNOSIS — F439 Reaction to severe stress, unspecified: Secondary | ICD-10-CM

## 2015-12-23 DIAGNOSIS — M25562 Pain in left knee: Secondary | ICD-10-CM

## 2015-12-23 DIAGNOSIS — M25561 Pain in right knee: Secondary | ICD-10-CM | POA: Diagnosis not present

## 2015-12-23 MED ORDER — METAXALONE 800 MG PO TABS: ORAL_TABLET | ORAL | 0 refills | Status: DC

## 2015-12-23 NOTE — Progress Notes (Signed)
Patient ID: Yolanda Perkins, female   DOB: 18-May-1950, 65 y.o.   MRN: [id number]   Subjective:    Patient ID: Yolanda Perkins, female    DOB: June 23, 1950, 65 y.o.   MRN: [id number]  HPI  Patient here for a scheduled follow up.  She is doing better.  Husband is doing better.  Stress is better.  Overall she feels she is handling things relatively well.  No chest pain.  No sob.  Weight is up some.  Eating.  No nausea or vomiting.  Bowels stable.  Having bilateral knee pain.  Left > right.  Worse when changes positions.  Also worse on uneven ground.  Persistent.  Request referral to ortho.     Past Medical History:  Diagnosis Date  . Chicken pox   . Depression   . Migraines   . Urinary tract bacterial infections    Past Surgical History:  Procedure Laterality Date  . [place]  . TONSILLECTOMY AND ADENOIDECTOMY  1954   Family History  Problem Relation Age of Onset  . Arthritis Mother     rheumatoid -which is in remission  . Osteoporosis Mother   . Transient ischemic attack Mother   . Congestive Heart Failure Mother   . Diverticulitis Mother   . Ovarian cancer Mother   . Heart disease Father     s/p bypass surgery  . Breast cancer Maternal Aunt   . Uterine cancer Maternal Aunt   . Uterine cancer Maternal Grandmother   . Breast cancer Maternal Aunt    Social History   Social History  . Marital status: Married    Spouse name: N/A  . Number of children: N/A  . Years of education: N/A   Social History Main Topics  . Smoking status: Never Smoker  . Smokeless tobacco: Never Used  . Alcohol use No  . Drug use: No  . Sexual activity: Not Asked   Other Topics Concern  . None   Social History Narrative   She is married and has 2 daughters.    Outpatient Encounter Prescriptions as of 12/23/2015  Medication Sig  . buPROPion (WELLBUTRIN SR) 150 MG 12 hr tablet Take 1 tablet (150 mg total) by mouth daily.  . Calcium Carbonate-Vit D-Min 600-400 MG-UNIT TABS Take 1 tablet by  mouth 2 (two) times daily.  . Cholecalciferol (VITAMIN D3) 1000 UNITS CAPS Take 1 capsule by mouth daily.  Marland Kitchen estradiol (ESTRACE) 0.1 MG/GM vaginal cream Use as directed.  . meloxicam (MOBIC) 7.5 MG tablet Take 1 tablet (7.5 mg total) by mouth daily as needed for pain. With meals  . metaxalone (SKELAXIN) 800 MG tablet 1/2 - 1 tablet q pm prn.  . Misc Natural Products (GLUCOSAMINE-CHONDROITIN SULF) TABS Take 1 tablet by mouth daily.  . Multiple Vitamin (MULTIVITAMIN) tablet Take 1 tablet by mouth daily.  Marland Kitchen omeprazole (PRILOSEC) 20 MG capsule Take 1 capsule (20 mg total) by mouth daily.  Marland Kitchen pyridoxine (B-6) 100 MG tablet Take 100 mg by mouth daily.  . raloxifene (EVISTA) 60 MG tablet Take 1 tablet (60 mg total) by mouth daily.  . SUMAtriptan (IMITREX) 50 MG tablet Take 1/2 tablet my mouth as  directed as needed.  . [DISCONTINUED] metaxalone (SKELAXIN) 800 MG tablet 1/2 - 1 tablet q pm prn.   No facility-administered encounter medications on file as of 12/23/2015.     Review of Systems  Constitutional: Negative for appetite change and unexpected weight change.  HENT: Negative for congestion  and sinus pressure.   Respiratory: Negative for cough, chest tightness and shortness of breath.   Cardiovascular: Negative for chest pain, palpitations and leg swelling.  Gastrointestinal: Negative for abdominal pain, diarrhea, nausea and vomiting.  Genitourinary: Negative for difficulty urinating and dysuria.  Musculoskeletal:       Bilateral knee pain as outlined.  Persistent.    Skin: Negative for color change and rash.  Neurological: Negative for dizziness, light-headedness and headaches.  Psychiatric/Behavioral: Negative for agitation and dysphoric mood.       Objective:    Physical Exam  Constitutional: She appears well-developed and well-nourished. No distress.  HENT:  Nose: Nose normal.  Mouth/Throat: Oropharynx is clear and moist.  Neck: Neck supple. No thyromegaly present.    Cardiovascular: Normal rate and regular rhythm.   Pulmonary/Chest: Breath sounds normal. No respiratory distress. She has no wheezes.  Abdominal: Soft. Bowel sounds are normal. There is no tenderness.  Musculoskeletal: She exhibits no edema or tenderness.  Lymphadenopathy:    She has no cervical adenopathy.  Skin: No rash noted. No erythema.  Psychiatric: She has a normal mood and affect. Her behavior is normal.    BP 112/76   Pulse 88   Temp 98.2 F (36.8 C) (Oral)   Ht 5\' 2"  (1.575 m)   Wt 117 lb 3.2 oz (53.2 kg)   LMP 04/19/1999   SpO2 97%   BMI 21.44 kg/m  Wt Readings from Last 3 Encounters:  12/23/15 117 lb 3.2 oz (53.2 kg)  10/10/15 112 lb 12 oz (51.1 kg)  01/25/15 123 lb (55.8 kg)     Lab Results  Component Value Date   WBC 5.5 09/01/2014   HGB 12.8 09/01/2014   HCT 37.8 09/01/2014   PLT 204.0 09/01/2014   GLUCOSE 94 07/25/2015   CHOL 175 07/25/2015   TRIG 95.0 07/25/2015   HDL 52.40 07/25/2015   LDLCALC 103 (H) 07/25/2015   ALT 18 07/25/2015   AST 22 07/25/2015   NA 140 07/25/2015   K 4.2 07/25/2015   CL 105 07/25/2015   CREATININE 0.95 07/25/2015   BUN 24 (H) 07/25/2015   CO2 31 07/25/2015   TSH 1.96 09/01/2014    Mm Screening Breast Tomo Bilateral  Result Date: 10/14/2015 CLINICAL DATA:  Screening. EXAM: 2D DIGITAL SCREENING BILATERAL MAMMOGRAM WITH CAD AND ADJUNCT TOMO COMPARISON:  Previous exam(s). ACR Breast Density Category b: There are scattered areas of fibroglandular density. FINDINGS: There are no findings suspicious for malignancy. Images were processed with CAD. IMPRESSION: No mammographic evidence of malignancy. A result letter of this screening mammogram will be mailed directly to the patient. RECOMMENDATION: Screening mammogram in one year. (Code:SM-B-01Y) BI-RADS CATEGORY  1: Negative. Electronically Signed   By: Ammie Ferrier M.D.   On: 10/14/2015 07:59       Assessment & Plan:   Problem List Items Addressed This Visit    Knee  pain - Primary    Persistent bilateral knee pain.  Request referral to ortho.  Order placed for referral.        Relevant Orders   Ambulatory referral to Orthopedic Surgery   Stress    Increased stress as outlined.  Discussed with her today.  Better.  Follow.  Continue current medication regimen.         Other Visit Diagnoses   None.      Einar Pheasant, MD

## 2015-12-23 NOTE — Progress Notes (Signed)
Pre visit review using our clinic review tool, if applicable. No additional management support is needed unless otherwise documented below in the visit note. 

## 2015-12-24 ENCOUNTER — Encounter: Payer: Self-pay | Admitting: Internal Medicine

## 2015-12-24 NOTE — Assessment & Plan Note (Signed)
Persistent bilateral knee pain.  Request referral to ortho.  Order placed for referral.

## 2015-12-24 NOTE — Assessment & Plan Note (Signed)
Increased stress as outlined.  Discussed with her today.  Better.  Follow.  Continue current medication regimen.

## 2016-01-27 ENCOUNTER — Other Ambulatory Visit: Payer: Self-pay

## 2016-01-27 MED ORDER — RALOXIFENE HCL 60 MG PO TABS: ORAL_TABLET | ORAL | 0 refills | Status: DC

## 2016-02-17 DIAGNOSIS — M25562 Pain in left knee: Secondary | ICD-10-CM | POA: Diagnosis not present

## 2016-02-17 DIAGNOSIS — M170 Bilateral primary osteoarthritis of knee: Secondary | ICD-10-CM | POA: Diagnosis not present

## 2016-02-17 DIAGNOSIS — M25561 Pain in right knee: Secondary | ICD-10-CM | POA: Diagnosis not present

## 2016-04-13 ENCOUNTER — Other Ambulatory Visit: Payer: Self-pay | Admitting: Internal Medicine

## 2016-04-23 ENCOUNTER — Encounter: Payer: Self-pay | Admitting: Internal Medicine

## 2016-04-23 ENCOUNTER — Ambulatory Visit (INDEPENDENT_AMBULATORY_CARE_PROVIDER_SITE_OTHER): Payer: Medicare HMO | Admitting: Internal Medicine

## 2016-04-23 DIAGNOSIS — F439 Reaction to severe stress, unspecified: Secondary | ICD-10-CM | POA: Diagnosis not present

## 2016-04-23 DIAGNOSIS — R0989 Other specified symptoms and signs involving the circulatory and respiratory systems: Secondary | ICD-10-CM | POA: Diagnosis not present

## 2016-04-23 DIAGNOSIS — R69 Illness, unspecified: Secondary | ICD-10-CM | POA: Diagnosis not present

## 2016-04-23 DIAGNOSIS — Z23 Encounter for immunization: Secondary | ICD-10-CM

## 2016-04-23 DIAGNOSIS — M25561 Pain in right knee: Secondary | ICD-10-CM | POA: Diagnosis not present

## 2016-04-23 DIAGNOSIS — M25562 Pain in left knee: Secondary | ICD-10-CM

## 2016-04-23 NOTE — Progress Notes (Signed)
Pre visit review using our clinic review tool, if applicable. No additional management support is needed unless otherwise documented below in the visit note. 

## 2016-04-23 NOTE — Progress Notes (Signed)
Patient ID: Yolanda Perkins, female   DOB: 1950/06/24, 66 y.o.   MRN: [id number]   Subjective:    Patient ID: Yolanda Perkins, female    DOB: 03-28-1951, 66 y.o.   MRN: [id number]  HPI  Patient here for a scheduled follow up.  States she is doing well.  She is planning to have knee surgery 05/25/16.  No chest pain.  No sob.  No acid reflux.  No abdominal pain or cramping.  Bowels moving.  Overall she feels good.  The knee pain is her only issue.  Is limiting her activity.     Past Medical History:  Diagnosis Date  . Chicken pox   . Depression   . Migraines   . Urinary tract bacterial infections    Past Surgical History:  Procedure Laterality Date  . [place]  . TONSILLECTOMY AND ADENOIDECTOMY  1954   Family History  Problem Relation Age of Onset  . Arthritis Mother     rheumatoid -which is in remission  . Osteoporosis Mother   . Transient ischemic attack Mother   . Congestive Heart Failure Mother   . Diverticulitis Mother   . Ovarian cancer Mother   . Heart disease Father     s/p bypass surgery  . Breast cancer Maternal Aunt   . Uterine cancer Maternal Aunt   . Uterine cancer Maternal Grandmother   . Breast cancer Maternal Aunt    Social History   Social History  . Marital status: Married    Spouse name: N/A  . Number of children: N/A  . Years of education: N/A   Social History Main Topics  . Smoking status: Never Smoker  . Smokeless tobacco: Never Used  . Alcohol use No  . Drug use: No  . Sexual activity: Not Asked   Other Topics Concern  . None   Social History Narrative   She is married and has 2 daughters.    Outpatient Encounter Prescriptions as of 04/23/2016  Medication Sig  . buPROPion (WELLBUTRIN SR) 150 MG 12 hr tablet Take 1 tablet (150 mg total) by mouth daily.  . Calcium Carbonate-Vit D-Min 600-400 MG-UNIT TABS Take 1 tablet by mouth 2 (two) times daily.  . Cholecalciferol (VITAMIN D3) 1000 UNITS CAPS Take 1 capsule by mouth daily.  Marland Kitchen  estradiol (ESTRACE) 0.1 MG/GM vaginal cream Use as directed.  . meloxicam (MOBIC) 7.5 MG tablet Take 1 tablet (7.5 mg total) by mouth daily as needed for pain. With meals  . metaxalone (SKELAXIN) 800 MG tablet 1/2 - 1 tablet q pm prn.  . omeprazole (PRILOSEC) 20 MG capsule Take 1 capsule (20 mg total) by mouth daily.  Marland Kitchen pyridoxine (B-6) 100 MG tablet Take 100 mg by mouth daily.  . raloxifene (EVISTA) 60 MG tablet Take 1 tablet (60 mg total) by mouth daily.  . SUMAtriptan (IMITREX) 50 MG tablet Take 1/2 tablet my mouth as  directed as needed.  . Misc Natural Products (GLUCOSAMINE-CHONDROITIN SULF) TABS Take 1 tablet by mouth daily.  . Multiple Vitamin (MULTIVITAMIN) tablet Take 1 tablet by mouth daily.   No facility-administered encounter medications on file as of 04/23/2016.     Review of Systems  Constitutional: Negative for appetite change and unexpected weight change.  HENT: Negative for congestion and sinus pressure.   Respiratory: Negative for cough, chest tightness and shortness of breath.   Cardiovascular: Negative for chest pain, palpitations and leg swelling.  Gastrointestinal: Negative for abdominal pain, diarrhea, nausea and  vomiting.  Genitourinary: Negative for difficulty urinating and dysuria.  Musculoskeletal: Negative for back pain and joint swelling.       Knee pain as outlined.    Skin: Negative for color change and rash.  Neurological: Negative for dizziness, light-headedness and headaches.  Psychiatric/Behavioral: Negative for agitation and dysphoric mood.       Objective:     Blood pressure rechecked by me:  130/72  Physical Exam  Constitutional: She appears well-developed and well-nourished. No distress.  HENT:  Nose: Nose normal.  Mouth/Throat: Oropharynx is clear and moist.  Neck: Neck supple. No thyromegaly present.  Cardiovascular: Normal rate and regular rhythm.   Pulmonary/Chest: Breath sounds normal. No respiratory distress. She has no wheezes.    Abdominal: Soft. Bowel sounds are normal. There is no tenderness.  Abdominal bruit  Musculoskeletal: She exhibits no edema or tenderness.  Lymphadenopathy:    She has no cervical adenopathy.  Skin: No rash noted. No erythema.  Psychiatric: She has a normal mood and affect. Her behavior is normal.    BP 128/74 (BP Location: Left Arm, Patient Position: Sitting, Cuff Size: Normal)   Pulse 72   Temp 98.4 F (36.9 C) (Oral)   Resp 16   Wt 119 lb 2 oz (54 kg)   LMP 04/19/1999   SpO2 99%   BMI 21.79 kg/m  Wt Readings from Last 3 Encounters:  04/23/16 119 lb 2 oz (54 kg)  12/23/15 117 lb 3.2 oz (53.2 kg)  10/10/15 112 lb 12 oz (51.1 kg)     Lab Results  Component Value Date   WBC 5.5 09/01/2014   HGB 12.8 09/01/2014   HCT 37.8 09/01/2014   PLT 204.0 09/01/2014   GLUCOSE 94 07/25/2015   CHOL 175 07/25/2015   TRIG 95.0 07/25/2015   HDL 52.40 07/25/2015   LDLCALC 103 (H) 07/25/2015   ALT 18 07/25/2015   AST 22 07/25/2015   NA 140 07/25/2015   K 4.2 07/25/2015   CL 105 07/25/2015   CREATININE 0.95 07/25/2015   BUN 24 (H) 07/25/2015   CO2 31 07/25/2015   TSH 1.96 09/01/2014    Mm Screening Breast Tomo Bilateral  Result Date: 10/14/2015 CLINICAL DATA:  Screening. EXAM: 2D DIGITAL SCREENING BILATERAL MAMMOGRAM WITH CAD AND ADJUNCT TOMO COMPARISON:  Previous exam(s). ACR Breast Density Category b: There are scattered areas of fibroglandular density. FINDINGS: There are no findings suspicious for malignancy. Images were processed with CAD. IMPRESSION: No mammographic evidence of malignancy. A result letter of this screening mammogram will be mailed directly to the patient. RECOMMENDATION: Screening mammogram in one year. (Code:SM-B-01Y) BI-RADS CATEGORY  1: Negative. Electronically Signed   By: Ammie Ferrier M.D.   On: 10/14/2015 07:59       Assessment & Plan:   Problem List Items Addressed This Visit    Abdominal bruit    Schedule aortic ultrasound.        Relevant  Orders   [place], RETROPERITNL ABD,  LTD   Knee pain    Seeing ortho.  Planning for knee surgery.  Going for pre op.  No chest pain.  No sob.       Stress    Better.  Doing well.  Follow.         Other Visit Diagnoses    Encounter for immunization       Relevant Orders   Flu vaccine HIGH DOSE PF (Completed)       Einar Pheasant, MD

## 2016-04-24 ENCOUNTER — Telehealth: Payer: Self-pay | Admitting: Internal Medicine

## 2016-04-24 NOTE — Telephone Encounter (Signed)
Placed in blue folder for signature.

## 2016-04-24 NOTE — Telephone Encounter (Signed)
Pt dropped off Disability Parking Placard form to be filled out. Placed in Dr. Bary Leriche colored  folder upfront.. Please advise pt when ready

## 2016-04-25 NOTE — Telephone Encounter (Signed)
Left message for patient to call.  Need to know if patient wants me to mail form or if she wants to come by office to pick up

## 2016-04-25 NOTE — Telephone Encounter (Signed)
Pt requested to pick form up

## 2016-04-25 NOTE — Telephone Encounter (Signed)
Form placed up front for pick up.

## 2016-04-25 NOTE — Telephone Encounter (Signed)
Form completed.  Placed in box.   

## 2016-04-29 DIAGNOSIS — R0989 Other specified symptoms and signs involving the circulatory and respiratory systems: Secondary | ICD-10-CM | POA: Insufficient documentation

## 2016-04-29 NOTE — Assessment & Plan Note (Addendum)
Seeing ortho.  Planning for knee surgery.  Going for pre op.  No chest pain.  No sob.

## 2016-04-29 NOTE — Assessment & Plan Note (Signed)
Schedule aortic ultrasound.   

## 2016-04-29 NOTE — Assessment & Plan Note (Signed)
Better.  Doing well.  Follow.

## 2016-04-30 ENCOUNTER — Other Ambulatory Visit: Payer: Self-pay | Admitting: Internal Medicine

## 2016-04-30 DIAGNOSIS — R0989 Other specified symptoms and signs involving the circulatory and respiratory systems: Secondary | ICD-10-CM

## 2016-04-30 NOTE — Progress Notes (Signed)
Order placed for aortic ultrasound.   

## 2016-05-09 ENCOUNTER — Ambulatory Visit: Payer: Medicare HMO

## 2016-05-14 ENCOUNTER — Other Ambulatory Visit: Payer: Self-pay | Admitting: Internal Medicine

## 2016-05-14 ENCOUNTER — Ambulatory Visit
Admission: RE | Admit: 2016-05-14 | Discharge: 2016-05-14 | Disposition: A | Discharge status: Home / Self Care | Payer: Medicare HMO | Source: Ambulatory Visit | Attending: Internal Medicine | Admitting: Internal Medicine

## 2016-05-14 DIAGNOSIS — N281 Cyst of kidney, acquired: Secondary | ICD-10-CM | POA: Diagnosis not present

## 2016-05-14 DIAGNOSIS — R0989 Other specified symptoms and signs involving the circulatory and respiratory systems: Secondary | ICD-10-CM | POA: Diagnosis present

## 2016-05-15 ENCOUNTER — Encounter: Payer: Self-pay | Admitting: Internal Medicine

## 2016-05-17 DIAGNOSIS — M21169 Varus deformity, not elsewhere classified, unspecified knee: Secondary | ICD-10-CM | POA: Diagnosis not present

## 2016-05-17 DIAGNOSIS — M170 Bilateral primary osteoarthritis of knee: Secondary | ICD-10-CM | POA: Diagnosis not present

## 2016-05-17 DIAGNOSIS — M25562 Pain in left knee: Secondary | ICD-10-CM | POA: Diagnosis not present

## 2016-05-17 DIAGNOSIS — G43909 Migraine, unspecified, not intractable, without status migrainosus: Secondary | ICD-10-CM | POA: Diagnosis not present

## 2016-05-17 DIAGNOSIS — Z7901 Long term (current) use of anticoagulants: Secondary | ICD-10-CM | POA: Diagnosis not present

## 2016-05-17 DIAGNOSIS — Z01818 Encounter for other preprocedural examination: Secondary | ICD-10-CM | POA: Diagnosis not present

## 2016-05-17 DIAGNOSIS — M25561 Pain in right knee: Secondary | ICD-10-CM | POA: Diagnosis not present

## 2016-05-17 DIAGNOSIS — Z01812 Encounter for preprocedural laboratory examination: Secondary | ICD-10-CM | POA: Diagnosis not present

## 2016-05-17 DIAGNOSIS — K219 Gastro-esophageal reflux disease without esophagitis: Secondary | ICD-10-CM | POA: Diagnosis not present

## 2016-05-17 DIAGNOSIS — M1990 Unspecified osteoarthritis, unspecified site: Secondary | ICD-10-CM | POA: Diagnosis not present

## 2016-05-17 DIAGNOSIS — Z5181 Encounter for therapeutic drug level monitoring: Secondary | ICD-10-CM | POA: Diagnosis not present

## 2016-05-25 DIAGNOSIS — M1990 Unspecified osteoarthritis, unspecified site: Secondary | ICD-10-CM | POA: Diagnosis not present

## 2016-05-25 DIAGNOSIS — Z96652 Presence of left artificial knee joint: Secondary | ICD-10-CM | POA: Diagnosis not present

## 2016-05-25 DIAGNOSIS — Z7982 Long term (current) use of aspirin: Secondary | ICD-10-CM | POA: Diagnosis not present

## 2016-05-25 DIAGNOSIS — K219 Gastro-esophageal reflux disease without esophagitis: Secondary | ICD-10-CM | POA: Diagnosis not present

## 2016-05-25 DIAGNOSIS — I9581 Postprocedural hypotension: Secondary | ICD-10-CM | POA: Diagnosis not present

## 2016-05-25 DIAGNOSIS — Z471 Aftercare following joint replacement surgery: Secondary | ICD-10-CM | POA: Diagnosis not present

## 2016-05-25 DIAGNOSIS — Z79810 Long term (current) use of selective estrogen receptor modulators (SERMs): Secondary | ICD-10-CM | POA: Diagnosis not present

## 2016-05-25 DIAGNOSIS — Z791 Long term (current) use of non-steroidal anti-inflammatories (NSAID): Secondary | ICD-10-CM | POA: Diagnosis not present

## 2016-05-25 DIAGNOSIS — Z880 Allergy status to penicillin: Secondary | ICD-10-CM | POA: Diagnosis not present

## 2016-05-25 DIAGNOSIS — M1711 Unilateral primary osteoarthritis, right knee: Secondary | ICD-10-CM | POA: Diagnosis not present

## 2016-05-25 DIAGNOSIS — M1712 Unilateral primary osteoarthritis, left knee: Secondary | ICD-10-CM | POA: Diagnosis not present

## 2016-05-25 DIAGNOSIS — M7550 Bursitis of unspecified shoulder: Secondary | ICD-10-CM | POA: Diagnosis not present

## 2016-05-25 DIAGNOSIS — M810 Age-related osteoporosis without current pathological fracture: Secondary | ICD-10-CM | POA: Diagnosis not present

## 2016-05-25 DIAGNOSIS — R69 Illness, unspecified: Secondary | ICD-10-CM | POA: Diagnosis not present

## 2016-05-29 DIAGNOSIS — M7550 Bursitis of unspecified shoulder: Secondary | ICD-10-CM | POA: Diagnosis not present

## 2016-05-29 DIAGNOSIS — Z96652 Presence of left artificial knee joint: Secondary | ICD-10-CM | POA: Diagnosis not present

## 2016-05-29 DIAGNOSIS — Z79891 Long term (current) use of opiate analgesic: Secondary | ICD-10-CM | POA: Diagnosis not present

## 2016-05-29 DIAGNOSIS — Z471 Aftercare following joint replacement surgery: Secondary | ICD-10-CM | POA: Diagnosis not present

## 2016-05-29 DIAGNOSIS — Z87440 Personal history of urinary (tract) infections: Secondary | ICD-10-CM | POA: Diagnosis not present

## 2016-05-29 DIAGNOSIS — R69 Illness, unspecified: Secondary | ICD-10-CM | POA: Diagnosis not present

## 2016-06-12 DIAGNOSIS — M1712 Unilateral primary osteoarthritis, left knee: Secondary | ICD-10-CM | POA: Diagnosis not present

## 2016-06-13 DIAGNOSIS — M25562 Pain in left knee: Secondary | ICD-10-CM | POA: Diagnosis not present

## 2016-06-15 DIAGNOSIS — M25562 Pain in left knee: Secondary | ICD-10-CM | POA: Diagnosis not present

## 2016-06-15 DIAGNOSIS — M25662 Stiffness of left knee, not elsewhere classified: Secondary | ICD-10-CM | POA: Diagnosis not present

## 2016-06-18 DIAGNOSIS — M25562 Pain in left knee: Secondary | ICD-10-CM | POA: Diagnosis not present

## 2016-06-20 DIAGNOSIS — M25662 Stiffness of left knee, not elsewhere classified: Secondary | ICD-10-CM | POA: Diagnosis not present

## 2016-06-20 DIAGNOSIS — M25562 Pain in left knee: Secondary | ICD-10-CM | POA: Diagnosis not present

## 2016-06-25 DIAGNOSIS — M25562 Pain in left knee: Secondary | ICD-10-CM | POA: Diagnosis not present

## 2016-06-25 DIAGNOSIS — M25662 Stiffness of left knee, not elsewhere classified: Secondary | ICD-10-CM | POA: Diagnosis not present

## 2016-06-26 ENCOUNTER — Telehealth: Payer: Self-pay | Admitting: *Deleted

## 2016-06-26 NOTE — Telephone Encounter (Signed)
FYI : Patient would like to inform Dr. Nicki Reaper that her knee surgery went well,physical therapy as well.  Pt contact [phone]

## 2016-06-26 NOTE — Telephone Encounter (Signed)
Noted. Keep us posted

## 2016-06-26 NOTE — Telephone Encounter (Signed)
Please advise 

## 2016-06-27 DIAGNOSIS — M25662 Stiffness of left knee, not elsewhere classified: Secondary | ICD-10-CM | POA: Diagnosis not present

## 2016-06-27 DIAGNOSIS — M25562 Pain in left knee: Secondary | ICD-10-CM | POA: Diagnosis not present

## 2016-07-02 DIAGNOSIS — M25562 Pain in left knee: Secondary | ICD-10-CM | POA: Diagnosis not present

## 2016-07-02 DIAGNOSIS — M25662 Stiffness of left knee, not elsewhere classified: Secondary | ICD-10-CM | POA: Diagnosis not present

## 2016-07-04 DIAGNOSIS — M25662 Stiffness of left knee, not elsewhere classified: Secondary | ICD-10-CM | POA: Diagnosis not present

## 2016-07-04 DIAGNOSIS — M25562 Pain in left knee: Secondary | ICD-10-CM | POA: Diagnosis not present

## 2016-07-05 DIAGNOSIS — Z96652 Presence of left artificial knee joint: Secondary | ICD-10-CM | POA: Diagnosis not present

## 2016-07-05 DIAGNOSIS — Z471 Aftercare following joint replacement surgery: Secondary | ICD-10-CM | POA: Diagnosis not present

## 2016-07-05 DIAGNOSIS — Z4789 Encounter for other orthopedic aftercare: Secondary | ICD-10-CM | POA: Diagnosis not present

## 2016-07-06 DIAGNOSIS — M25662 Stiffness of left knee, not elsewhere classified: Secondary | ICD-10-CM | POA: Diagnosis not present

## 2016-07-06 DIAGNOSIS — M25562 Pain in left knee: Secondary | ICD-10-CM | POA: Diagnosis not present

## 2016-07-11 DIAGNOSIS — M25562 Pain in left knee: Secondary | ICD-10-CM | POA: Diagnosis not present

## 2016-07-11 DIAGNOSIS — M25662 Stiffness of left knee, not elsewhere classified: Secondary | ICD-10-CM | POA: Diagnosis not present

## 2016-07-13 DIAGNOSIS — M25562 Pain in left knee: Secondary | ICD-10-CM | POA: Diagnosis not present

## 2016-07-13 DIAGNOSIS — M25662 Stiffness of left knee, not elsewhere classified: Secondary | ICD-10-CM | POA: Diagnosis not present

## 2016-07-20 DIAGNOSIS — M25562 Pain in left knee: Secondary | ICD-10-CM | POA: Diagnosis not present

## 2016-07-20 DIAGNOSIS — M25662 Stiffness of left knee, not elsewhere classified: Secondary | ICD-10-CM | POA: Diagnosis not present

## 2016-07-25 DIAGNOSIS — M25662 Stiffness of left knee, not elsewhere classified: Secondary | ICD-10-CM | POA: Diagnosis not present

## 2016-07-25 DIAGNOSIS — M25562 Pain in left knee: Secondary | ICD-10-CM | POA: Diagnosis not present

## 2016-08-01 DIAGNOSIS — M25562 Pain in left knee: Secondary | ICD-10-CM | POA: Diagnosis not present

## 2016-08-01 DIAGNOSIS — M25662 Stiffness of left knee, not elsewhere classified: Secondary | ICD-10-CM | POA: Diagnosis not present

## 2016-08-08 DIAGNOSIS — M25562 Pain in left knee: Secondary | ICD-10-CM | POA: Diagnosis not present

## 2016-08-08 DIAGNOSIS — M25662 Stiffness of left knee, not elsewhere classified: Secondary | ICD-10-CM | POA: Diagnosis not present

## 2016-08-28 ENCOUNTER — Other Ambulatory Visit: Payer: Self-pay | Admitting: Internal Medicine

## 2016-10-17 ENCOUNTER — Telehealth: Payer: Self-pay | Admitting: Internal Medicine

## 2016-10-17 ENCOUNTER — Other Ambulatory Visit: Payer: Self-pay | Admitting: Internal Medicine

## 2016-10-17 NOTE — Telephone Encounter (Signed)
Please call pt and see if she can come in at 1:30 today.  Would need to be seen before calling in prednisone.

## 2016-10-17 NOTE — Telephone Encounter (Signed)
Patient scheduled 10/18/16/at 9.FYI

## 2016-10-17 NOTE — Telephone Encounter (Signed)
Pt called and stated that she got into poison oak over the weekend and has tried the topical ointments but they are not helping and now it is spreading. Can we call in a prednisone taper for her?  Please advise, thank you!  Call pt @ [phone]  Pharmacy - [facility] - [place], [place]

## 2016-10-17 NOTE — Telephone Encounter (Signed)
Left message to call office to schedule patient Dr. Nicki Reaper tomorrow at 18

## 2016-10-17 NOTE — Telephone Encounter (Signed)
Reason for call: rash Symptoms: right forearm spread to  wrist , itchy  Duration since Friday  Medications: Cortaid ointment  , OTC Benadryl tablet q hs no relief  Last seen for this problem: Seen by:  Wants you to call in Prednisone taper , states you have done this in the past , offered appointment with Joycelyn Schmid but declined

## 2016-10-18 ENCOUNTER — Encounter: Payer: Self-pay | Admitting: Internal Medicine

## 2016-10-18 ENCOUNTER — Ambulatory Visit (INDEPENDENT_AMBULATORY_CARE_PROVIDER_SITE_OTHER): Payer: Medicare HMO | Admitting: Internal Medicine

## 2016-10-18 VITALS — BP 120/82 | HR 61 | Temp 98.7°F | Resp 12 | Wt 117.0 lb

## 2016-10-18 DIAGNOSIS — Z1239 Encounter for other screening for malignant neoplasm of breast: Secondary | ICD-10-CM

## 2016-10-18 DIAGNOSIS — R21 Rash and other nonspecific skin eruption: Secondary | ICD-10-CM

## 2016-10-18 DIAGNOSIS — Z1231 Encounter for screening mammogram for malignant neoplasm of breast: Secondary | ICD-10-CM

## 2016-10-18 DIAGNOSIS — R000 Tachycardia, unspecified: Secondary | ICD-10-CM | POA: Diagnosis not present

## 2016-10-18 DIAGNOSIS — F439 Reaction to severe stress, unspecified: Secondary | ICD-10-CM

## 2016-10-18 DIAGNOSIS — D649 Anemia, unspecified: Secondary | ICD-10-CM

## 2016-10-18 DIAGNOSIS — R69 Illness, unspecified: Secondary | ICD-10-CM | POA: Diagnosis not present

## 2016-10-18 LAB — CBC WITH DIFFERENTIAL/PLATELET
BASOS ABS: 0 10*3/uL (ref 0.0–0.1)
Basophils Relative: 0.8 % (ref 0.0–3.0)
EOS ABS: 0.1 10*3/uL (ref 0.0–0.7)
EOS PCT: 2.4 % (ref 0.0–5.0)
HCT: 39.5 % (ref 36.0–46.0)
HEMOGLOBIN: 13 g/dL (ref 12.0–15.0)
LYMPHS ABS: 1.2 10*3/uL (ref 0.7–4.0)
Lymphocytes Relative: 20.2 % (ref 12.0–46.0)
MCHC: 32.8 g/dL (ref 30.0–36.0)
MCV: 95.8 fl (ref 78.0–100.0)
MONO ABS: 0.4 10*3/uL (ref 0.1–1.0)
Monocytes Relative: 6.2 % (ref 3.0–12.0)
NEUTROS PCT: 70.4 % (ref 43.0–77.0)
Neutro Abs: 4.2 10*3/uL (ref 1.4–7.7)
Platelets: 248 10*3/uL (ref 150.0–400.0)
RBC: 4.12 Mil/uL (ref 3.87–5.11)
RDW: 13.1 % (ref 11.5–15.5)
WBC: 6 10*3/uL (ref 4.0–10.5)

## 2016-10-18 LAB — COMPREHENSIVE METABOLIC PANEL
ALK PHOS: 65 U/L (ref 39–117)
ALT: 17 U/L (ref 0–35)
AST: 22 U/L (ref 0–37)
Albumin: 4.1 g/dL (ref 3.5–5.2)
BILIRUBIN TOTAL: 0.7 mg/dL (ref 0.2–1.2)
BUN: 23 mg/dL (ref 6–23)
CO2: 30 mEq/L (ref 19–32)
Calcium: 9.4 mg/dL (ref 8.4–10.5)
Chloride: 106 mEq/L (ref 96–112)
Creatinine, Ser: 1.05 mg/dL (ref 0.40–1.20)
GFR: 55.34 mL/min — AB (ref >60.00)
GLUCOSE: 98 mg/dL (ref 70–99)
Potassium: 5.3 mEq/L — ABNORMAL HIGH (ref 3.5–5.1)
SODIUM: 140 meq/L (ref 135–145)
TOTAL PROTEIN: 6.7 g/dL (ref 6.0–8.3)

## 2016-10-18 LAB — TSH: TSH: 1.29 u[IU]/mL (ref 0.35–4.50)

## 2016-10-18 LAB — FERRITIN: FERRITIN: 44.2 ng/mL (ref 10.0–291.0)

## 2016-10-18 MED ORDER — TRIAMCINOLONE ACETONIDE 0.1 % EX CREA: 1.0000 "application " | TOPICAL_CREAM | Freq: Two times a day (BID) | CUTANEOUS | 0 refills | Status: AC

## 2016-10-18 MED ORDER — METHYLPREDNISOLONE 4 MG PO TBPK: ORAL_TABLET | ORAL | 0 refills | Status: DC

## 2016-10-18 NOTE — Progress Notes (Signed)
Pre-visit discussion using our clinic review tool. No additional management support is needed unless otherwise documented below in the visit note.  

## 2016-10-18 NOTE — Progress Notes (Signed)
Patient ID: Yolanda Perkins, female   DOB: 1951/03/18, 66 y.o.   MRN: [id number]   Subjective:    Patient ID: Yolanda Perkins, female    DOB: January 20, 1951, 67 y.o.   MRN: [id number]  HPI  Patient here as a work in with concerns regarding a rash.  She was working in her yard several days ago.  Noticed rash on her forearms.  Used something otc and the rash is better today.  Just localized to both arms.  No other rash.  No fever.  Increased stress with her job.  Discussed with her today.  She feels she is handling things relatively well.  Eating and drinking.     Past Medical History:  Diagnosis Date  . Chicken pox   . Depression   . Migraines   . Urinary tract bacterial infections    Past Surgical History:  Procedure Laterality Date  . [place]  . TONSILLECTOMY AND ADENOIDECTOMY  1954   Family History  Problem Relation Age of Onset  . Arthritis Mother        rheumatoid -which is in remission  . Osteoporosis Mother   . Transient ischemic attack Mother   . Congestive Heart Failure Mother   . Diverticulitis Mother   . Ovarian cancer Mother   . Heart disease Father        s/p bypass surgery  . Breast cancer Maternal Aunt   . Uterine cancer Maternal Aunt   . Uterine cancer Maternal Grandmother   . Breast cancer Maternal Aunt    Social History   Social History  . Marital status: Married    Spouse name: N/A  . Number of children: N/A  . Years of education: N/A   Social History Main Topics  . Smoking status: Never Smoker  . Smokeless tobacco: Never Used  . Alcohol use No  . Drug use: No  . Sexual activity: Not Asked   Other Topics Concern  . None   Social History Narrative   She is married and has 2 daughters.    Outpatient Encounter Prescriptions as of 10/18/2016  Medication Sig  . buPROPion (WELLBUTRIN SR) 150 MG 12 hr tablet Take 1 tablet (150 mg total) by mouth daily.  . Calcium Carbonate-Vit D-Min 600-400 MG-UNIT TABS Take 1 tablet by mouth 2 (two) times  daily.  . Cholecalciferol (VITAMIN D3) 1000 UNITS CAPS Take 1 capsule by mouth daily.  Marland Kitchen estradiol (ESTRACE) 0.1 MG/GM vaginal cream Use as directed.  . meloxicam (MOBIC) 7.5 MG tablet Take 1 tablet (7.5 mg total) by mouth daily as needed for pain. With meals  . metaxalone (SKELAXIN) 800 MG tablet 1/2 - 1 tablet q pm prn.  . Misc Natural Products (GLUCOSAMINE-CHONDROITIN SULF) TABS Take 1 tablet by mouth daily.  . Multiple Vitamin (MULTIVITAMIN) tablet Take 1 tablet by mouth daily.  Marland Kitchen omeprazole (PRILOSEC) 20 MG capsule Take 1 capsule (20 mg total) by mouth daily.  Marland Kitchen pyridoxine (B-6) 100 MG tablet Take 100 mg by mouth daily.  . raloxifene (EVISTA) 60 MG tablet Take 1 tablet (60 mg total) by mouth daily.  . SUMAtriptan (IMITREX) 50 MG tablet Take 1/2 tablet my mouth as  directed as needed.  . methylPREDNISolone (MEDROL DOSEPAK) 4 MG TBPK tablet Medrol dose pack - 6 day taper.  Take as directed.  . triamcinolone cream (KENALOG) 0.1 % Apply 1 application topically 2 (two) times daily.   No facility-administered encounter medications on file as of 10/18/2016.  Review of Systems  Constitutional: Negative for appetite change and unexpected weight change.  Respiratory: Negative for cough and shortness of breath.   Gastrointestinal: Negative for diarrhea, nausea and vomiting.  Skin: Negative for color change and rash.  Psychiatric/Behavioral:       Increased stress as outlined.         Objective:    Physical Exam  HENT:  Nose: Nose normal.  Mouth/Throat: Oropharynx is clear and moist.  Neck: Neck supple.  Cardiovascular: Normal rate and regular rhythm.   Pulmonary/Chest: Breath sounds normal. No respiratory distress. She has no wheezes.  Abdominal: Soft. Bowel sounds are normal. There is no tenderness.  Musculoskeletal: She exhibits no edema or tenderness.  Lymphadenopathy:    She has no cervical adenopathy.  Skin:  Small lesions - bilateral forearms.  No other rash.      BP  120/82 (BP Location: Left Arm, Patient Position: Sitting, Cuff Size: Normal)   Pulse 61   Temp 98.7 F (37.1 C) (Oral)   Resp 12   Wt 117 lb (53.1 kg)   LMP 04/19/1999   SpO2 96%   BMI 21.40 kg/m  Wt Readings from Last 3 Encounters:  10/18/16 117 lb (53.1 kg)  04/23/16 119 lb 2 oz (54 kg)  12/23/15 117 lb 3.2 oz (53.2 kg)     Lab Results  Component Value Date   WBC 6.0 10/18/2016   HGB 13.0 10/18/2016   HCT 39.5 10/18/2016   PLT 248.0 10/18/2016   GLUCOSE 98 10/18/2016   CHOL 175 07/25/2015   TRIG 95.0 07/25/2015   HDL 52.40 07/25/2015   LDLCALC 103 (H) 07/25/2015   ALT 17 10/18/2016   AST 22 10/18/2016   NA 140 10/18/2016   K 5.3 (H) 10/18/2016   CL 106 10/18/2016   CREATININE 1.05 10/18/2016   BUN 23 10/18/2016   CO2 30 10/18/2016   TSH 1.29 10/18/2016    [place] Retroperitoneal Comp  Result Date: 05/14/2016 CLINICAL DATA:  Abdominal bruit EXAM: ULTRASOUND RETROPERITONEAL COMPLETE TECHNIQUE: Ultrasound examination of the abdominal aorta was performed to evaluate for abdominal aortic aneurysm. The common iliac arteries, IVC, and kidneys were also evaluated. COMPARISON:  Report of an abdominal and pelvic CT scan of July 05, 2000. FINDINGS: Abdominal Aorta No aneurysm identified. Maximum AP Diameter:  1.9 cm Maximum TRV Diameter: 1.9 cm Right Common Iliac Artery No aneurysm identified. Left Common Iliac Artery No aneurysm identified. IVC No abnormality visualized. Right Kidney Length: 8.8 cm Echogenicity within normal limits. No mass or hydronephrosis visualized. Left Kidney Length: 8.5 cm The cortical echogenicity is normal. There is a lower pole simple appearing cyst measuring 1.8 x 2.5 x 1.6 cm. There is no hydronephrosis. IMPRESSION: No abdominal aortic aneurysm or iliac artery aneurysm. Simple appearing lower pole cyst in the left kidney. The kidneys are otherwise unremarkable. Electronically Signed   By: David  Martinique M.D.   On: 05/14/2016 08:43       Assessment & Plan:     Problem List Items Addressed This Visit    Stress    Increased stress as outlined.  Discussed with her today.  On wellbutrin.  Does not feel needs anything more at this time.  Follow.  Will notify me if feels needs anything more.          Other Visit Diagnoses    Breast cancer screening    -  Primary   Relevant Orders   MM SCREENING BREAST TOMO BILATERAL   Tachycardia  Relevant Orders   Comprehensive metabolic panel (Completed)   TSH (Completed)   Anemia, unspecified type       Relevant Orders   CBC with Differential/Platelet (Completed)   Ferritin (Completed)   Rash       Contact dermatitis.  Topical TCC as directed.  Gave her medrol dose pack to have if needed.  Will not use unless needed.         Einar Pheasant, MD

## 2016-10-19 ENCOUNTER — Other Ambulatory Visit: Payer: Self-pay | Admitting: Internal Medicine

## 2016-10-19 DIAGNOSIS — E875 Hyperkalemia: Secondary | ICD-10-CM

## 2016-10-19 NOTE — Progress Notes (Signed)
Order placed for f/u potassium check.  

## 2016-10-20 ENCOUNTER — Encounter: Payer: Self-pay | Admitting: Internal Medicine

## 2016-10-20 NOTE — Assessment & Plan Note (Signed)
Increased stress as outlined.  Discussed with her today.  On wellbutrin.  Does not feel needs anything more at this time.  Follow.  Will notify me if feels needs anything more.

## 2016-10-22 ENCOUNTER — Other Ambulatory Visit (INDEPENDENT_AMBULATORY_CARE_PROVIDER_SITE_OTHER): Payer: Medicare HMO

## 2016-10-22 DIAGNOSIS — E875 Hyperkalemia: Secondary | ICD-10-CM

## 2016-10-22 LAB — POTASSIUM: Potassium: 4.1 mEq/L (ref 3.5–5.1)

## 2016-10-23 ENCOUNTER — Encounter: Payer: Self-pay | Admitting: Internal Medicine

## 2016-10-26 ENCOUNTER — Encounter: Payer: Medicare HMO | Admitting: Internal Medicine

## 2016-11-02 ENCOUNTER — Ambulatory Visit
Admission: RE | Admit: 2016-11-02 | Discharge: 2016-11-02 | Disposition: A | Discharge status: Home / Self Care | Payer: Medicare HMO | Source: Ambulatory Visit | Attending: Internal Medicine | Admitting: Internal Medicine

## 2016-11-02 DIAGNOSIS — Z1239 Encounter for other screening for malignant neoplasm of breast: Secondary | ICD-10-CM

## 2016-11-02 DIAGNOSIS — Z1231 Encounter for screening mammogram for malignant neoplasm of breast: Secondary | ICD-10-CM | POA: Diagnosis not present

## 2016-11-04 ENCOUNTER — Other Ambulatory Visit: Payer: Self-pay | Admitting: Internal Medicine

## 2016-11-22 DIAGNOSIS — Z96652 Presence of left artificial knee joint: Secondary | ICD-10-CM | POA: Diagnosis not present

## 2016-12-07 ENCOUNTER — Other Ambulatory Visit: Payer: Self-pay | Admitting: Internal Medicine

## 2016-12-31 ENCOUNTER — Encounter: Payer: Self-pay | Admitting: Internal Medicine

## 2016-12-31 ENCOUNTER — Ambulatory Visit (INDEPENDENT_AMBULATORY_CARE_PROVIDER_SITE_OTHER): Payer: Medicare HMO | Admitting: Internal Medicine

## 2016-12-31 VITALS — BP 118/78 | HR 68 | Temp 98.6°F | Resp 12 | Ht 62.0 in | Wt 119.0 lb

## 2016-12-31 DIAGNOSIS — M8580 Other specified disorders of bone density and structure, unspecified site: Secondary | ICD-10-CM | POA: Diagnosis not present

## 2016-12-31 DIAGNOSIS — F439 Reaction to severe stress, unspecified: Secondary | ICD-10-CM

## 2016-12-31 DIAGNOSIS — Z0000 Encounter for general adult medical examination without abnormal findings: Secondary | ICD-10-CM | POA: Diagnosis not present

## 2016-12-31 DIAGNOSIS — Z13220 Encounter for screening for lipoid disorders: Secondary | ICD-10-CM

## 2016-12-31 DIAGNOSIS — M545 Low back pain, unspecified: Secondary | ICD-10-CM

## 2016-12-31 DIAGNOSIS — R69 Illness, unspecified: Secondary | ICD-10-CM | POA: Diagnosis not present

## 2016-12-31 NOTE — Assessment & Plan Note (Signed)
Physical today 12/31/16.  Mammogram 11/02/16 - Birads I.  Colonoscopy 10/20/14.  Recommended f/u colonoscopy in 10 years.

## 2016-12-31 NOTE — Progress Notes (Signed)
Patient ID: Yolanda Perkins, female   DOB: September 19, 1950, 66 y.o.   MRN: [id number]   Subjective:    Patient ID: Yolanda Perkins, female    DOB: 09/25/1950, 66 y.o.   MRN: [id number]  HPI  Patient here for her physical exam.  She reports she is doing relatively well.  Retired.  Stress is better.  She is working some.  Overall feels she is doing well.  Stays active.  No chest pain.  No sob.  No acid reflux.  No abdominal pain.  Bowels moving.  Some low back discomfort.  No radiation down her leg.  Stretches and exercise.  Not a significant issue for her.  Desires no further intervention.     Past Medical History:  Diagnosis Date  . Chicken pox   . Depression   . Migraines   . Urinary tract bacterial infections    Past Surgical History:  Procedure Laterality Date  . [place]  . TONSILLECTOMY AND ADENOIDECTOMY  1954   Family History  Problem Relation Age of Onset  . Arthritis Mother        rheumatoid -which is in remission  . Osteoporosis Mother   . Transient ischemic attack Mother   . Congestive Heart Failure Mother   . Diverticulitis Mother   . Ovarian cancer Mother   . Heart disease Father        s/p bypass surgery  . Breast cancer Maternal Aunt   . Uterine cancer Maternal Aunt   . Uterine cancer Maternal Grandmother   . Breast cancer Maternal Aunt    Social History   Social History  . Marital status: Married    Spouse name: N/A  . Number of children: N/A  . Years of education: N/A   Social History Main Topics  . Smoking status: Never Smoker  . Smokeless tobacco: Never Used  . Alcohol use No  . Drug use: No  . Sexual activity: Not Asked   Other Topics Concern  . None   Social History Narrative   She is married and has 2 daughters.    Outpatient Encounter Prescriptions as of 12/31/2016  Medication Sig  . buPROPion (WELLBUTRIN SR) 150 MG 12 hr tablet Take 1 tablet (150 mg total) by mouth daily.  . Calcium Carbonate-Vit D-Min 600-400 MG-UNIT TABS Take 1  tablet by mouth 2 (two) times daily.  . Cholecalciferol (VITAMIN D3) 1000 UNITS CAPS Take 1 capsule by mouth daily.  Marland Kitchen estradiol (ESTRACE) 0.1 MG/GM vaginal cream Use as directed.  . meloxicam (MOBIC) 7.5 MG tablet Take 1 tablet (7.5 mg total) by mouth daily as needed for pain. With meals  . metaxalone (SKELAXIN) 800 MG tablet 1/2 - 1 tablet q pm prn.  . methylPREDNISolone (MEDROL DOSEPAK) 4 MG TBPK tablet Medrol dose pack - 6 day taper.  Take as directed.  . Misc Natural Products (GLUCOSAMINE-CHONDROITIN SULF) TABS Take 1 tablet by mouth daily.  . Multiple Vitamin (MULTIVITAMIN) tablet Take 1 tablet by mouth daily.  Marland Kitchen omeprazole (PRILOSEC) 20 MG capsule Take 1 capsule (20 mg total) by mouth daily.  Marland Kitchen pyridoxine (B-6) 100 MG tablet Take 100 mg by mouth daily.  . raloxifene (EVISTA) 60 MG tablet Take 1 tablet (60 mg total) by mouth daily.  . SUMAtriptan (IMITREX) 50 MG tablet Take 1/2 tablet my mouth as  directed as needed.  . triamcinolone cream (KENALOG) 0.1 % Apply 1 application topically 2 (two) times daily.   No facility-administered encounter medications on  file as of 12/31/2016.     Review of Systems  Constitutional: Negative for appetite change and unexpected weight change.  HENT: Negative for congestion and sinus pressure.   Eyes: Negative for pain and visual disturbance.  Respiratory: Negative for cough, chest tightness and shortness of breath.   Cardiovascular: Negative for chest pain, palpitations and leg swelling.  Gastrointestinal: Negative for abdominal pain, diarrhea, nausea and vomiting.  Genitourinary: Negative for difficulty urinating and dysuria.  Musculoskeletal: Negative for back pain and joint swelling.  Skin: Negative for color change and rash.  Neurological: Negative for dizziness, light-headedness and headaches.  Hematological: Negative for adenopathy. Does not bruise/bleed easily.  Psychiatric/Behavioral: Negative for agitation and dysphoric mood.         Objective:    Physical Exam  Constitutional: She is oriented to person, place, and time. She appears well-developed and well-nourished. No distress.  HENT:  Nose: Nose normal.  Mouth/Throat: Oropharynx is clear and moist.  Eyes: Right eye exhibits no discharge. Left eye exhibits no discharge. No scleral icterus.  Neck: Neck supple. No thyromegaly present.  Cardiovascular: Normal rate and regular rhythm.   Pulmonary/Chest: Breath sounds normal. No accessory muscle usage. No tachypnea. No respiratory distress. She has no decreased breath sounds. She has no wheezes. She has no rhonchi. Right breast exhibits no inverted nipple, no mass, no nipple discharge and no tenderness (no axillary adenopathy). Left breast exhibits no inverted nipple, no mass, no nipple discharge and no tenderness (no axilarry adenopathy).  Abdominal: Soft. Bowel sounds are normal. There is no tenderness.  Musculoskeletal: She exhibits no edema or tenderness.  Lymphadenopathy:    She has no cervical adenopathy.  Neurological: She is alert and oriented to person, place, and time.  Skin: Skin is warm. No rash noted.  Psychiatric: She has a normal mood and affect. Her behavior is normal.    BP 118/78 (BP Location: Left Arm, Patient Position: Sitting, Cuff Size: Normal)   Pulse 68   Temp 98.6 F (37 C) (Oral)   Resp 12   Ht 5\' 2"  (1.575 m)   Wt 119 lb (54 kg)   LMP 04/19/1999   SpO2 96%   BMI 21.77 kg/m  Wt Readings from Last 3 Encounters:  12/31/16 119 lb (54 kg)  10/18/16 117 lb (53.1 kg)  04/23/16 119 lb 2 oz (54 kg)     Lab Results  Component Value Date   WBC 6.0 10/18/2016   HGB 13.0 10/18/2016   HCT 39.5 10/18/2016   PLT 248.0 10/18/2016   GLUCOSE 98 10/18/2016   CHOL 175 07/25/2015   TRIG 95.0 07/25/2015   HDL 52.40 07/25/2015   LDLCALC 103 (H) 07/25/2015   ALT 17 10/18/2016   AST 22 10/18/2016   NA 140 10/18/2016   K 4.1 10/22/2016   CL 106 10/18/2016   CREATININE 1.05 10/18/2016   BUN 23  10/18/2016   CO2 30 10/18/2016   TSH 1.29 10/18/2016    Mm Screening Breast Tomo Bilateral  Result Date: 11/02/2016 CLINICAL DATA:  Screening. EXAM: 2D DIGITAL SCREENING BILATERAL MAMMOGRAM WITH CAD AND ADJUNCT TOMO COMPARISON:  Previous exam(s). ACR Breast Density Category b: There are scattered areas of fibroglandular density. FINDINGS: There are no findings suspicious for malignancy. Images were processed with CAD. IMPRESSION: No mammographic evidence of malignancy. A result letter of this screening mammogram will be mailed directly to the patient. RECOMMENDATION: Screening mammogram in one year. (Code:SM-B-01Y) BI-RADS CATEGORY  1: Negative. Electronically Signed   By: Shanon Brow  Ormond M.D.   On: 11/02/2016 15:47       Assessment & Plan:   Problem List Items Addressed This Visit    Back pain    Desires no further intervention.  Follow.        Health care maintenance    Physical today 12/31/16.  Mammogram 11/02/16 - Birads I.  Colonoscopy 10/20/14.  Recommended f/u colonoscopy in 10 years.        Osteopenia    On evista.  Follow vitamin D level.       Relevant Orders   Comprehensive metabolic panel   VITAMIN D 25 Hydroxy (Vit-D Deficiency, Fractures)   Stress    Overall doing well.  Follow.  Same medication regimen.         Other Visit Diagnoses    Routine general medical examination at a health care facility    -  Primary   Screening cholesterol level       Relevant Orders   Lipid panel       Einar Pheasant, MD

## 2017-01-03 ENCOUNTER — Encounter: Payer: Self-pay | Admitting: Internal Medicine

## 2017-01-03 NOTE — Assessment & Plan Note (Signed)
On evista.  Follow vitamin D level.

## 2017-01-03 NOTE — Assessment & Plan Note (Signed)
Desires no further intervention.  Follow.  

## 2017-01-03 NOTE — Assessment & Plan Note (Signed)
Overall doing well.  Follow.  Same medication regimen.

## 2017-01-15 DIAGNOSIS — J0140 Acute pansinusitis, unspecified: Secondary | ICD-10-CM | POA: Diagnosis not present

## 2017-03-04 DIAGNOSIS — R69 Illness, unspecified: Secondary | ICD-10-CM | POA: Diagnosis not present

## 2017-03-20 ENCOUNTER — Other Ambulatory Visit: Payer: Self-pay

## 2017-03-20 MED ORDER — BUPROPION HCL ER (SR) 150 MG PO TB12: 150.0000 mg | ORAL_TABLET | Freq: Every day | ORAL | 0 refills | Status: DC

## 2017-04-29 ENCOUNTER — Other Ambulatory Visit: Payer: Self-pay | Admitting: Internal Medicine

## 2017-06-17 ENCOUNTER — Other Ambulatory Visit: Payer: Self-pay | Admitting: Internal Medicine

## 2017-06-18 ENCOUNTER — Encounter: Payer: Self-pay | Admitting: Internal Medicine

## 2017-07-04 ENCOUNTER — Other Ambulatory Visit: Payer: Self-pay | Admitting: Internal Medicine

## 2017-07-05 ENCOUNTER — Other Ambulatory Visit: Payer: Medicare HMO

## 2017-07-08 ENCOUNTER — Ambulatory Visit: Payer: Medicare HMO | Admitting: Internal Medicine

## 2017-08-13 ENCOUNTER — Other Ambulatory Visit: Payer: Self-pay | Admitting: Internal Medicine

## 2017-09-24 ENCOUNTER — Ambulatory Visit: Payer: Medicare HMO

## 2017-09-24 ENCOUNTER — Ambulatory Visit: Payer: Medicare HMO | Admitting: Internal Medicine

## 2017-11-11 ENCOUNTER — Encounter: Payer: Self-pay | Admitting: Internal Medicine

## 2017-11-12 ENCOUNTER — Other Ambulatory Visit: Payer: Self-pay

## 2017-11-12 DIAGNOSIS — Z1239 Encounter for other screening for malignant neoplasm of breast: Secondary | ICD-10-CM

## 2017-11-21 ENCOUNTER — Other Ambulatory Visit: Payer: Self-pay | Admitting: Internal Medicine

## 2017-11-28 ENCOUNTER — Ambulatory Visit
Admission: RE | Admit: 2017-11-28 | Discharge: 2017-11-28 | Disposition: A | Discharge status: Home / Self Care | Payer: Medicare HMO | Source: Ambulatory Visit | Attending: Internal Medicine | Admitting: Internal Medicine

## 2017-11-28 DIAGNOSIS — Z1239 Encounter for other screening for malignant neoplasm of breast: Secondary | ICD-10-CM

## 2017-11-28 DIAGNOSIS — Z1231 Encounter for screening mammogram for malignant neoplasm of breast: Secondary | ICD-10-CM | POA: Insufficient documentation

## 2017-12-08 DIAGNOSIS — H1032 Unspecified acute conjunctivitis, left eye: Secondary | ICD-10-CM | POA: Diagnosis not present

## 2017-12-09 ENCOUNTER — Ambulatory Visit: Payer: Medicare HMO | Admitting: Internal Medicine

## 2017-12-19 DIAGNOSIS — S33140A Subluxation of L4/L5 lumbar vertebra, initial encounter: Secondary | ICD-10-CM | POA: Diagnosis not present

## 2017-12-19 DIAGNOSIS — M5432 Sciatica, left side: Secondary | ICD-10-CM | POA: Diagnosis not present

## 2017-12-23 DIAGNOSIS — M5432 Sciatica, left side: Secondary | ICD-10-CM | POA: Diagnosis not present

## 2017-12-23 DIAGNOSIS — S33140A Subluxation of L4/L5 lumbar vertebra, initial encounter: Secondary | ICD-10-CM | POA: Diagnosis not present

## 2017-12-31 DIAGNOSIS — M5432 Sciatica, left side: Secondary | ICD-10-CM | POA: Diagnosis not present

## 2017-12-31 DIAGNOSIS — S33140A Subluxation of L4/L5 lumbar vertebra, initial encounter: Secondary | ICD-10-CM | POA: Diagnosis not present

## 2018-01-14 DIAGNOSIS — S33140A Subluxation of L4/L5 lumbar vertebra, initial encounter: Secondary | ICD-10-CM | POA: Diagnosis not present

## 2018-01-14 DIAGNOSIS — M5432 Sciatica, left side: Secondary | ICD-10-CM | POA: Diagnosis not present

## 2018-01-23 ENCOUNTER — Other Ambulatory Visit: Payer: Self-pay | Admitting: Internal Medicine

## 2018-02-04 ENCOUNTER — Encounter

## 2018-02-04 ENCOUNTER — Encounter: Payer: Self-pay | Admitting: Internal Medicine

## 2018-02-04 ENCOUNTER — Ambulatory Visit: Payer: Medicare HMO | Admitting: Internal Medicine

## 2018-02-04 VITALS — BP 118/72 | HR 88 | Temp 97.9°F | Resp 18 | Wt 121.4 lb

## 2018-02-04 DIAGNOSIS — M8580 Other specified disorders of bone density and structure, unspecified site: Secondary | ICD-10-CM

## 2018-02-04 DIAGNOSIS — R69 Illness, unspecified: Secondary | ICD-10-CM | POA: Diagnosis not present

## 2018-02-04 DIAGNOSIS — F439 Reaction to severe stress, unspecified: Secondary | ICD-10-CM

## 2018-02-04 DIAGNOSIS — Z23 Encounter for immunization: Secondary | ICD-10-CM | POA: Diagnosis not present

## 2018-02-04 DIAGNOSIS — Z13220 Encounter for screening for lipoid disorders: Secondary | ICD-10-CM | POA: Diagnosis not present

## 2018-02-04 MED ORDER — RALOXIFENE HCL 60 MG PO TABS: ORAL_TABLET | ORAL | 1 refills | Status: DC

## 2018-02-04 MED ORDER — OMEPRAZOLE 20 MG PO CPDR: 20.0000 mg | DELAYED_RELEASE_CAPSULE | Freq: Every day | ORAL | 1 refills | Status: DC

## 2018-02-04 MED ORDER — BUPROPION HCL ER (SR) 150 MG PO TB12: 150.0000 mg | ORAL_TABLET | Freq: Every day | ORAL | 1 refills | Status: DC

## 2018-02-04 NOTE — Progress Notes (Signed)
Patient ID: Yolanda Perkins, female   DOB: Sep 15, 1950, 67 y.o.   MRN: [id number]   Subjective:    Patient ID: Yolanda Perkins, female    DOB: 1951-01-22, 67 y.o.   MRN: [id number]  HPI  Patient here for a scheduled follow up.  She reports she is doing well.  Father passes away.  Handling stress relatively well.  Has good support.  Stays active.  No chest pain.  No sob. No acid reflux.  No abdominal pain.  Bowels moving.  No urine change.  Had colonoscopy 10/2014.  Recommended f/u in 10 years.  Discussed immunizations.     Past Medical History:  Diagnosis Date  . Chicken pox   . Depression   . Migraines   . Urinary tract bacterial infections    Past Surgical History:  Procedure Laterality Date  . [place]  . TONSILLECTOMY AND ADENOIDECTOMY  1954   Family History  Problem Relation Age of Onset  . Arthritis Mother        rheumatoid -which is in remission  . Osteoporosis Mother   . Transient ischemic attack Mother   . Congestive Heart Failure Mother   . Diverticulitis Mother   . Ovarian cancer Mother   . Heart disease Father        s/p bypass surgery  . Breast cancer Maternal Aunt   . Uterine cancer Maternal Aunt   . Uterine cancer Maternal Grandmother   . Breast cancer Maternal Aunt   . Breast cancer Cousin 32   Social History   Socioeconomic History  . Marital status: Married    Spouse name: Not on file  . Number of children: Not on file  . Years of education: Not on file  . Highest education level: Not on file  Occupational History  . Not on file  Social Needs  . Financial resource strain: Not on file  . Food insecurity:    Worry: Not on file    Inability: Not on file  . Transportation needs:    Medical: Not on file    Non-medical: Not on file  Tobacco Use  . Smoking status: Never Smoker  . Smokeless tobacco: Never Used  Substance and Sexual Activity  . Alcohol use: No    Alcohol/week: 0.0 standard drinks  . Drug use: No  . Sexual activity: Not on file   Lifestyle  . Physical activity:    Days per week: Not on file    Minutes per session: Not on file  . Stress: Not on file  Relationships  . Social connections:    Talks on phone: Not on file    Gets together: Not on file    Attends religious service: Not on file    Active member of club or organization: Not on file    Attends meetings of clubs or organizations: Not on file    Relationship status: Not on file  Other Topics Concern  . Not on file  Social History Narrative   She is married and has 2 daughters.    Outpatient Encounter Medications as of 02/04/2018  Medication Sig  . buPROPion (WELLBUTRIN SR) 150 MG 12 hr tablet Take 1 tablet (150 mg total) by mouth daily.  . Calcium Carbonate-Vit D-Min 600-400 MG-UNIT TABS Take 1 tablet by mouth 2 (two) times daily.  . Cholecalciferol (VITAMIN D3) 1000 UNITS CAPS Take 1 capsule by mouth daily.  Marland Kitchen estradiol (ESTRACE) 0.1 MG/GM vaginal cream Use as directed.  . meloxicam (  MOBIC) 7.5 MG tablet Take 1 tablet (7.5 mg total) by mouth daily as needed for pain. With meals  . metaxalone (SKELAXIN) 800 MG tablet 1/2 - 1 tablet q pm prn.  . methylPREDNISolone (MEDROL DOSEPAK) 4 MG TBPK tablet Medrol dose pack - 6 day taper.  Take as directed.  . Misc Natural Products (GLUCOSAMINE-CHONDROITIN SULF) TABS Take 1 tablet by mouth daily.  . Multiple Vitamin (MULTIVITAMIN) tablet Take 1 tablet by mouth daily.  Marland Kitchen omeprazole (PRILOSEC) 20 MG capsule Take 1 capsule (20 mg total) by mouth daily.  Marland Kitchen pyridoxine (B-6) 100 MG tablet Take 100 mg by mouth daily.  . raloxifene (EVISTA) 60 MG tablet Take 1 tablet (60 mg total) by mouth daily.  . SUMAtriptan (IMITREX) 50 MG tablet Take 1/2 tablet my mouth as  directed as needed.  . triamcinolone cream (KENALOG) 0.1 % Apply 1 application topically 2 (two) times daily.  . [DISCONTINUED] buPROPion (WELLBUTRIN SR) 150 MG 12 hr tablet Take 1 tablet (150 mg total) by mouth daily.  . [DISCONTINUED] omeprazole (PRILOSEC) 20  MG capsule Take 1 capsule (20 mg total) by mouth daily.  . [DISCONTINUED] raloxifene (EVISTA) 60 MG tablet Take 1 tablet (60 mg total) by mouth daily.   No facility-administered encounter medications on file as of 02/04/2018.     Review of Systems  Constitutional: Negative for appetite change and unexpected weight change.  HENT: Negative for congestion and sinus pressure.   Eyes: Negative for pain and visual disturbance.  Respiratory: Negative for cough, chest tightness and shortness of breath.   Cardiovascular: Negative for chest pain, palpitations and leg swelling.  Gastrointestinal: Negative for abdominal pain, diarrhea, nausea and vomiting.  Genitourinary: Negative for difficulty urinating and dysuria.  Musculoskeletal: Negative for joint swelling and myalgias.  Skin: Negative for color change and rash.  Neurological: Negative for dizziness, light-headedness and headaches.  Hematological: Negative for adenopathy. Does not bruise/bleed easily.  Psychiatric/Behavioral: Negative for agitation and dysphoric mood.       Objective:    Physical Exam  Constitutional: She appears well-developed and well-nourished. No distress.  HENT:  Nose: Nose normal.  Mouth/Throat: Oropharynx is clear and moist.  Neck: Neck supple. No thyromegaly present.  Cardiovascular: Normal rate and regular rhythm.  Pulmonary/Chest: Breath sounds normal. No respiratory distress. She has no wheezes.  Abdominal: Soft. Bowel sounds are normal. There is no tenderness.  Musculoskeletal: She exhibits no edema or tenderness.  Lymphadenopathy:    She has no cervical adenopathy.  Skin: No rash noted. No erythema.  Psychiatric: She has a normal mood and affect. Her behavior is normal.    BP 118/72 (BP Location: Left Arm, Patient Position: Sitting, Cuff Size: Normal)   Pulse 88   Temp 97.9 F (36.6 C) (Oral)   Resp 18   Wt 121 lb 6.4 oz (55.1 kg)   LMP 04/19/1999   SpO2 98%   BMI 22.20 kg/m  Wt Readings from  Last 3 Encounters:  02/04/18 121 lb 6.4 oz (55.1 kg)  12/31/16 119 lb (54 kg)  10/18/16 117 lb (53.1 kg)     Lab Results  Component Value Date   WBC 6.0 10/18/2016   HGB 13.0 10/18/2016   HCT 39.5 10/18/2016   PLT 248.0 10/18/2016   GLUCOSE 98 10/18/2016   CHOL 175 07/25/2015   TRIG 95.0 07/25/2015   HDL 52.40 07/25/2015   LDLCALC 103 (H) 07/25/2015   ALT 17 10/18/2016   AST 22 10/18/2016   NA 140 10/18/2016  K 4.1 10/22/2016   CL 106 10/18/2016   CREATININE 1.05 10/18/2016   BUN 23 10/18/2016   CO2 30 10/18/2016   TSH 1.29 10/18/2016    Mm 3d Screen Breast Bilateral  Result Date: 11/29/2017 CLINICAL DATA:  Screening. EXAM: DIGITAL SCREENING BILATERAL MAMMOGRAM WITH TOMO AND CAD COMPARISON:  Previous exam(s). ACR Breast Density Category b: There are scattered areas of fibroglandular density. FINDINGS: There are no findings suspicious for malignancy. Images were processed with CAD. IMPRESSION: No mammographic evidence of malignancy. A result letter of this screening mammogram will be mailed directly to the patient. RECOMMENDATION: Screening mammogram in one year. (Code:SM-B-01Y) BI-RADS CATEGORY  1: Negative. Electronically Signed   By: Claudie Revering M.D.   On: 11/29/2017 09:32       Assessment & Plan:   Problem List Items Addressed This Visit    Osteopenia    On evista.  Follow.        Stress    Increased stress.  Discussed with her today.  She feels she is handling things relatively well.  Follow.        Relevant Orders   CBC with Differential/Platelet   Hepatic function panel   TSH   Basic metabolic panel    Other Visit Diagnoses    Screening cholesterol level    -  Primary   Relevant Orders   Lipid panel   Encounter for immunization       Relevant Orders   Flu vaccine HIGH DOSE PF (Completed)       Einar Pheasant, MD

## 2018-02-06 DIAGNOSIS — S33140A Subluxation of L4/L5 lumbar vertebra, initial encounter: Secondary | ICD-10-CM | POA: Diagnosis not present

## 2018-02-06 DIAGNOSIS — M5432 Sciatica, left side: Secondary | ICD-10-CM | POA: Diagnosis not present

## 2018-02-09 ENCOUNTER — Encounter: Payer: Self-pay | Admitting: Internal Medicine

## 2018-02-09 NOTE — Assessment & Plan Note (Signed)
On evista.  Follow.

## 2018-02-09 NOTE — Assessment & Plan Note (Signed)
Increased stress.  Discussed with her today.  She feels she is handling things relatively well.  Follow.   

## 2018-03-11 ENCOUNTER — Other Ambulatory Visit (INDEPENDENT_AMBULATORY_CARE_PROVIDER_SITE_OTHER): Payer: Medicare HMO

## 2018-03-11 ENCOUNTER — Ambulatory Visit (INDEPENDENT_AMBULATORY_CARE_PROVIDER_SITE_OTHER): Payer: Medicare HMO | Admitting: *Deleted

## 2018-03-11 ENCOUNTER — Encounter: Payer: Self-pay | Admitting: Internal Medicine

## 2018-03-11 DIAGNOSIS — F439 Reaction to severe stress, unspecified: Secondary | ICD-10-CM | POA: Diagnosis not present

## 2018-03-11 DIAGNOSIS — Z23 Encounter for immunization: Secondary | ICD-10-CM | POA: Diagnosis not present

## 2018-03-11 DIAGNOSIS — Z13220 Encounter for screening for lipoid disorders: Secondary | ICD-10-CM

## 2018-03-11 DIAGNOSIS — R69 Illness, unspecified: Secondary | ICD-10-CM | POA: Diagnosis not present

## 2018-03-11 LAB — BASIC METABOLIC PANEL
BUN: 20 mg/dL (ref 6–23)
CO2: 30 meq/L (ref 19–32)
Calcium: 9.4 mg/dL (ref 8.4–10.5)
Chloride: 105 mEq/L (ref 96–112)
Creatinine, Ser: 0.94 mg/dL (ref 0.40–1.20)
GFR: 62.62 mL/min (ref >60.00)
Glucose, Bld: 92 mg/dL (ref 70–99)
POTASSIUM: 4.8 meq/L (ref 3.5–5.1)
SODIUM: 140 meq/L (ref 135–145)

## 2018-03-11 LAB — CBC WITH DIFFERENTIAL/PLATELET
Basophils Absolute: 0.1 10*3/uL (ref 0.0–0.1)
Basophils Relative: 1.2 % (ref 0.0–3.0)
EOS PCT: 5 % (ref 0.0–5.0)
Eosinophils Absolute: 0.3 10*3/uL (ref 0.0–0.7)
HCT: 40.2 % (ref 36.0–46.0)
Hemoglobin: 13.5 g/dL (ref 12.0–15.0)
Lymphocytes Relative: 24.6 % (ref 12.0–46.0)
Lymphs Abs: 1.3 10*3/uL (ref 0.7–4.0)
MCHC: 33.6 g/dL (ref 30.0–36.0)
MCV: 94.2 fl (ref 78.0–100.0)
MONO ABS: 0.4 10*3/uL (ref 0.1–1.0)
Monocytes Relative: 7.2 % (ref 3.0–12.0)
Neutro Abs: 3.2 10*3/uL (ref 1.4–7.7)
Neutrophils Relative %: 62 % (ref 43.0–77.0)
Platelets: 220 10*3/uL (ref 150.0–400.0)
RBC: 4.27 Mil/uL (ref 3.87–5.11)
RDW: 13.1 % (ref 11.5–15.5)
WBC: 5.2 10*3/uL (ref 4.0–10.5)

## 2018-03-11 LAB — LIPID PANEL
Cholesterol: 171 mg/dL (ref 0–200)
HDL: 63 mg/dL (ref >39.00)
LDL Cholesterol: 94 mg/dL (ref 0–99)
NonHDL: 108.33
Total CHOL/HDL Ratio: 3
Triglycerides: 73 mg/dL (ref 0.0–149.0)
VLDL: 14.6 mg/dL (ref 0.0–40.0)

## 2018-03-11 LAB — TSH: TSH: 1.11 u[IU]/mL (ref 0.35–4.50)

## 2018-03-11 LAB — HEPATIC FUNCTION PANEL
ALT: 17 U/L (ref 0–35)
AST: 26 U/L (ref 0–37)
Albumin: 4 g/dL (ref 3.5–5.2)
Alkaline Phosphatase: 61 U/L (ref 39–117)
Bilirubin, Direct: 0.1 mg/dL (ref 0.0–0.3)
Total Bilirubin: 0.6 mg/dL (ref 0.2–1.2)
Total Protein: 6.3 g/dL (ref 6.0–8.3)

## 2018-03-11 NOTE — Progress Notes (Signed)
Patient tolerated immunization with out voicing any concerns before , after or during injection.

## 2018-07-10 ENCOUNTER — Encounter: Payer: Self-pay | Admitting: Internal Medicine

## 2018-07-10 NOTE — Telephone Encounter (Signed)
I have left this patient a message to get more information but wanted you to have this message since it is the weekend.

## 2018-07-23 ENCOUNTER — Ambulatory Visit (INDEPENDENT_AMBULATORY_CARE_PROVIDER_SITE_OTHER): Payer: Medicare HMO

## 2018-07-23 ENCOUNTER — Other Ambulatory Visit: Payer: Self-pay

## 2018-07-23 DIAGNOSIS — Z0000 Encounter for general adult medical examination without abnormal findings: Secondary | ICD-10-CM | POA: Diagnosis not present

## 2018-07-23 NOTE — Progress Notes (Signed)
Subjective:   Yolanda Perkins is a 68 y.o. female who presents for an Initial Medicare Annual Wellness Visit.  Review of Systems    No ROS.  Medicare Wellness Visit. Additional risk factors are reflected in the social history.  Cardiac Risk Factors include: advanced age (>42men, >26 women)     Objective:    Today's Vitals   There is no height or weight on file to calculate BMI.  Advanced Directives 07/23/2018  Does Patient Have a Medical Advance Directive? No  Would patient like information on creating a medical advance directive? No - Patient declined    Current Medications (verified) Outpatient Encounter Medications as of 07/23/2018  Medication Sig  . buPROPion (WELLBUTRIN SR) 150 MG 12 hr tablet Take 1 tablet (150 mg total) by mouth daily.  . Calcium Carbonate-Vit D-Min 600-400 MG-UNIT TABS Take 1 tablet by mouth 2 (two) times daily.  . Cholecalciferol (VITAMIN D3) 1000 UNITS CAPS Take 1 capsule by mouth daily.  Marland Kitchen estradiol (ESTRACE) 0.1 MG/GM vaginal cream Use as directed.  . meloxicam (MOBIC) 7.5 MG tablet Take 1 tablet (7.5 mg total) by mouth daily as needed for pain. With meals  . metaxalone (SKELAXIN) 800 MG tablet 1/2 - 1 tablet q pm prn.  . methylPREDNISolone (MEDROL DOSEPAK) 4 MG TBPK tablet Medrol dose pack - 6 day taper.  Take as directed.  . Misc Natural Products (GLUCOSAMINE-CHONDROITIN SULF) TABS Take 1 tablet by mouth daily.  . Multiple Vitamin (MULTIVITAMIN) tablet Take 1 tablet by mouth daily.  Marland Kitchen omeprazole (PRILOSEC) 20 MG capsule Take 1 capsule (20 mg total) by mouth daily.  Marland Kitchen pyridoxine (B-6) 100 MG tablet Take 100 mg by mouth daily.  . raloxifene (EVISTA) 60 MG tablet Take 1 tablet (60 mg total) by mouth daily.  . SUMAtriptan (IMITREX) 50 MG tablet Take 1/2 tablet my mouth as  directed as needed.  . triamcinolone cream (KENALOG) 0.1 % Apply 1 application topically 2 (two) times daily.   No facility-administered encounter medications on file as of  07/23/2018.     Allergies (verified) Penicillins   History: Past Medical History:  Diagnosis Date  . Chicken pox   . Depression   . Migraines   . Urinary tract bacterial infections    Past Surgical History:  Procedure Laterality Date  . [place]  . TONSILLECTOMY AND ADENOIDECTOMY  1954   Family History  Problem Relation Age of Onset  . Arthritis Mother        rheumatoid -which is in remission  . Osteoporosis Mother   . Transient ischemic attack Mother   . Congestive Heart Failure Mother   . Diverticulitis Mother   . Ovarian cancer Mother   . Macular degeneration Mother   . Heart disease Father        s/p bypass surgery  . Macular degeneration Father   . Breast cancer Maternal Aunt   . Uterine cancer Maternal Aunt   . Uterine cancer Maternal Grandmother   . Breast cancer Maternal Aunt   . Breast cancer Cousin 3   Social History   Socioeconomic History  . Marital status: Married    Spouse name: Not on file  . Number of children: Not on file  . Years of education: Not on file  . Highest education level: Not on file  Occupational History  . Not on file  Social Needs  . Financial resource strain: Not hard at all  . Food insecurity:    Worry: Never  true    Inability: Never true  . Transportation needs:    Medical: No    Non-medical: No  Tobacco Use  . Smoking status: Never Smoker  . Smokeless tobacco: Never Used  Substance and Sexual Activity  . Alcohol use: No    Alcohol/week: 0.0 standard drinks  . Drug use: No  . Sexual activity: Not on file  Lifestyle  . Physical activity:    Days per week: 7 days    Minutes per session: 30 min  . Stress: Not at all  Relationships  . Social connections:    Talks on phone: Not on file    Gets together: Not on file    Attends religious service: Not on file    Active member of club or organization: Not on file    Attends meetings of clubs or organizations: Not on file    Relationship status: Not on  file  Other Topics Concern  . Not on file  Social History Narrative   She is married and has 2 daughters.    Tobacco Counseling Counseling given: Not Answered   Clinical Intake:  Pre-visit preparation completed: Yes        Diabetes: No  How often do you need to have someone help you when you read instructions, pamphlets, or other written materials from your doctor or pharmacy?: 1 - Never         Activities of Daily Living In your present state of health, do you have any difficulty performing the following activities: 07/23/2018  Hearing? N  Vision? N  Difficulty concentrating or making decisions? N  Walking or climbing stairs? N  Dressing or bathing? N  Doing errands, shopping? N  Preparing Food and eating ? N  Using the Toilet? N  In the past six months, have you accidently leaked urine? N  Do you have problems with loss of bowel control? N  Managing your Medications? N  Managing your Finances? N  Housekeeping or managing your Housekeeping? N  Some recent data might be hidden     Immunizations and Health Maintenance Immunization History  Administered Date(s) Administered  . Hepatitis B 04/03/1991  . Influenza Split 01/17/2012  . Influenza, High Dose Seasonal PF 04/23/2016, 02/04/2018  . Influenza,inj,Quad PF,6+ Mos 12/21/2013, 01/25/2015  . Influenza-Unspecified 03/04/2017  . Pneumococcal Conjugate-13 07/27/2014  . Pneumococcal Polysaccharide-23 03/11/2018  . Zoster 05/27/2015   Health Maintenance Due  Topic Date Due  . TETANUS/TDAP  06/16/1967  . DEXA SCAN  06/15/2013    Patient Care Team: Einar Pheasant, MD as PCP - General (Internal Medicine)  Indicate any recent Medical Services you may have received from other than [facility] providers in the past year (date may be approximate).     Assessment:   This is a routine wellness examination for Yolanda Perkins.  I connected with patient 07/23/18 at 10:30 AM EDT by a video enabled telemedicine application and  verified that I am speaking with the correct person using two identifiers. Patient stated full name and DOB. Patient gave permission to continue with virtual visit. Patient's location was at home and Nurse's location was at [place] office.   Health Screenings  Mammogram -11/28/17 Colonoscopy -10/20/14 Bone Density -discussed, she plans to follow up with pcp. Glaucoma -none Hearing -demonstrates Glucose- 92 Cholesterol -12/10 2019 TSH- 03/11/18 (1.11) Dental -every 6 months Vision -annual visits  Social  Alcohol intake -rare Smoking history- never Smokers in home? none Illicit drug use? none Exercise -walking Diet -low carb  Safety  Patient feels safe at home.  Patient does have smoke detectors at home  Patient does wear sunscreen or protective clothing when in direct sunlight  Patient does wear seat belt when driving or riding with others.   Activities of Daily Living Patient can do their own household chores. Denies needing assistance with: driving, feeding themselves, getting from bed to chair, getting to the toilet, bathing/showering, dressing, managing money, climbing flight of stairs, or preparing meals.   Depression Screen Patient denies losing interest in daily life, feeling hopeless, or crying easily over simple problems.   Fall Screen Patient denies being afraid of falling or falling in the last year.   Memory Screen Patient denies problems with memory, misplacing items, and is able to balance checkbook/bank accounts.  Patient is alert, normal appearance, oriented to person/place/and time. Correctly identified the president of the [place], recall of 3/3 objects, and performing simple calculations.  Patient displays appropriate judgement and can read correct time from watch face.  She reads and completes puzzles for brain exercise.   Immunizations The following Immunizations were discussed: Influenza, shingles, pneumonia, and tetanus.   Other Providers Patient Care  Team: Einar Pheasant, MD as PCP - General (Internal Medicine)  Hearing/Vision screen Hearing Screening Comments: Patient is able to hear conversational tones without difficulty.  No issues reported.   Vision Screening Comments: Followed by [facility] ([place]) Wears corrective lenses Annual visits Virtual visit, she has regular follow up with the ophthalmologist  Dietary issues and exercise activities discussed: Current Exercise Habits: Home exercise routine, Type of exercise: walking, Time (Minutes): 30, Frequency (Times/Week): 7, Weekly Exercise (Minutes/Week): 210, Intensity: Mild  Goals      Patient Stated   . Increase physical activity (pt-stated)     Increase walking, yoga, swim, kayak for exercise      Depression Screen PHQ 2/9 Scores 07/23/2018 12/31/2016 12/31/2016 12/23/2015 10/10/2015 07/27/2014 06/18/2013  PHQ - 2 Score 0 0 0 0 0 0 0  PHQ- 9 Score - 1 - - - - -    Fall Risk Fall Risk  07/23/2018 12/31/2016 12/23/2015 10/10/2015 07/27/2014  Falls in the past year? 0 No No No No   Cognitive Function:     6CIT Screen 07/23/2018  What Year? 0 points  What month? 0 points  What time? 0 points  Count back from 20 0 points  Months in reverse 0 points  Repeat phrase 0 points  Total Score 0    Screening Tests Health Maintenance  Topic Date Due  . TETANUS/TDAP  06/16/1967  . DEXA SCAN  06/15/2013  . MAMMOGRAM  11/29/2018  . COLONOSCOPY  10/19/2024  . Hepatitis C Screening  Completed  . PNA vac Low Risk Adult  Completed  . INFLUENZA VACCINE  Discontinued     Plan:    End of life planning; Advance aging; Advanced directives discussed. Copy of current HCPOA/Living Will requested upon completion.    I have personally reviewed and noted the following in the patient's chart:   . Medical and social history . Use of alcohol, tobacco or illicit drugs  . Current medications and supplements . Functional ability and status . Nutritional status . Physical activity .  Advanced directives . List of other physicians . Hospitalizations, surgeries, and ER visits in previous 12 months . Vitals . Screenings to include cognitive, depression, and falls . Referrals and appointments  In addition, I have reviewed and discussed with patient certain preventive protocols, quality metrics, and best practice recommendations. A written  personalized care plan for preventive services as well as general preventive health recommendations were provided to patient.     Varney Biles, LPN   0/92/3300    Reviewed above information.  Agree with assessment and plan.    Dr Nicki Reaper

## 2018-07-23 NOTE — Patient Instructions (Addendum)
  Yolanda Perkins , Thank you for taking time to come for your Medicare Wellness Visit. I appreciate your ongoing commitment to your health goals. Please review the following plan we discussed and let me know if I can assist you in the future.   These are the goals we discussed: Goals      Patient Stated   . Increase physical activity (pt-stated)     Increase walking, yoga, swim, kayak for exercise       This is a list of the screening recommended for you and due dates:  Health Maintenance  Topic Date Due  . Tetanus Vaccine  06/16/1967  . DEXA scan (bone density measurement)  06/15/2013  . Mammogram  11/29/2018  . Colon Cancer Screening  10/19/2024  .  Hepatitis C: One time screening is recommended by Center for Disease Control  (CDC) for  adults born from [birth year] through 1965.   Completed  . Pneumonia vaccines  Completed  . Flu Shot  Discontinued

## 2018-08-11 ENCOUNTER — Encounter: Payer: Self-pay | Admitting: Internal Medicine

## 2018-08-11 ENCOUNTER — Ambulatory Visit (INDEPENDENT_AMBULATORY_CARE_PROVIDER_SITE_OTHER): Payer: Medicare HMO | Admitting: Internal Medicine

## 2018-08-11 DIAGNOSIS — R69 Illness, unspecified: Secondary | ICD-10-CM | POA: Diagnosis not present

## 2018-08-11 DIAGNOSIS — F439 Reaction to severe stress, unspecified: Secondary | ICD-10-CM

## 2018-08-11 DIAGNOSIS — Z9109 Other allergy status, other than to drugs and biological substances: Secondary | ICD-10-CM

## 2018-08-11 MED ORDER — AZELASTINE HCL 0.1 % NA SOLN: 1.0000 | Freq: Two times a day (BID) | NASAL | 1 refills | Status: DC

## 2018-08-11 NOTE — Progress Notes (Signed)
Patient ID: Yolanda Perkins, female   DOB: Feb 16, 1951, 68 y.o.   MRN: [id number]   Virtual Visit via [employer] Note  This visit type was conducted due to national recommendations for restrictions regarding the COVID-19 pandemic (e.g. social distancing).  This format is felt to be most appropriate for this patient at this time.  All issues noted in this document were discussed and addressed.  No physical exam was performed (except for noted visual exam findings with Video Visits).   I connected with Bess Harvest by telephone enabled telemedicine application or telephone and verified that I am speaking with the correct person using two identifiers. Location patient: home Location provider: work Persons participating in the virtual visit: patient, provider  I discussed the limitations, risks, security and privacy concerns of performing an evaluation and management service by telephone and the availability of in person appointments. The patient expressed understanding and agreed to proceed.  Interactive audio and video telecommunications were attempted between this provider and patient, however failed, due to technical difficulties.  Pt was unable to connect to internet.  We continued and completed visit with audio only.   Reason for visit: acute visit.   HPI: She reports she is doing relatively well.  She has had some allergy symptoms.  Has noticed some nasal congestion and sneezing.  Right eustachian tube clogged.  Some popping.  Has taken claritin, but only takes occasionally.  Only uses flonase intermittently.  Wasn't sure if she could take/use these medications regularly.  No chest congestion.  No sob.  No fever.  No sinus pressure.  No chest pain or tightness.  No acid reflux.  No abdominal pain.  Bowels moving.  No urine change.  Handling stress. Overall doing well.  Has good support.  She went to [place] in 06/2018.  Had cold symptoms.  Family was sick.  Her symptoms resolved and her family's symptoms  resolved.  Everyone is feeling well now.     ROS: See pertinent positives and negatives per HPI.  Past Medical History:  Diagnosis Date   Chicken pox    Depression    Migraines    Urinary tract bacterial infections     Past Surgical History:  Procedure Laterality Date   HERNIA REPAIR  1962   TONSILLECTOMY AND ADENOIDECTOMY  1954    Family History  Problem Relation Age of Onset   Arthritis Mother        rheumatoid -which is in remission   Osteoporosis Mother    Transient ischemic attack Mother    Congestive Heart Failure Mother    Diverticulitis Mother    Ovarian cancer Mother    Macular degeneration Mother    Heart disease Father        s/p bypass surgery   Macular degeneration Father    Breast cancer Maternal Aunt    Uterine cancer Maternal Aunt    Uterine cancer Maternal Grandmother    Breast cancer Maternal Aunt    Breast cancer Cousin 62    SOCIAL HX: reviewed.    Current Outpatient Medications:    azelastine (ASTELIN) 0.1 % nasal spray, Place 1 spray into both nostrils 2 (two) times daily. Use in each nostril as directed, Disp: 30 mL, Rfl: 1   buPROPion (WELLBUTRIN SR) 150 MG 12 hr tablet, Take 1 tablet (150 mg total) by mouth daily., Disp: 90 tablet, Rfl: 1   Calcium Carbonate-Vit D-Min 600-400 MG-UNIT TABS, Take 1 tablet by mouth 2 (two) times daily., Disp: , Rfl:  Cholecalciferol (VITAMIN D3) 1000 UNITS CAPS, Take 1 capsule by mouth daily., Disp: , Rfl:    estradiol (ESTRACE) 0.1 MG/GM vaginal cream, Use as directed., Disp: 42.5 g, Rfl: 2   meloxicam (MOBIC) 7.5 MG tablet, Take 1 tablet (7.5 mg total) by mouth daily as needed for pain. With meals, Disp: 90 tablet, Rfl: 0   metaxalone (SKELAXIN) 800 MG tablet, 1/2 - 1 tablet q pm prn., Disp: 30 tablet, Rfl: 0   methylPREDNISolone (MEDROL DOSEPAK) 4 MG TBPK tablet, Medrol dose pack - 6 day taper.  Take as directed., Disp: 21 tablet, Rfl: 0   Misc Natural Products  (GLUCOSAMINE-CHONDROITIN SULF) TABS, Take 1 tablet by mouth daily., Disp: , Rfl:    Multiple Vitamin (MULTIVITAMIN) tablet, Take 1 tablet by mouth daily., Disp: , Rfl:    omeprazole (PRILOSEC) 20 MG capsule, Take 1 capsule (20 mg total) by mouth daily., Disp: 90 capsule, Rfl: 1   pyridoxine (B-6) 100 MG tablet, Take 100 mg by mouth daily., Disp: , Rfl:    raloxifene (EVISTA) 60 MG tablet, Take 1 tablet (60 mg total) by mouth daily., Disp: 90 tablet, Rfl: 1   SUMAtriptan (IMITREX) 50 MG tablet, Take 1/2 tablet my mouth as  directed as needed., Disp: 9 tablet, Rfl: 0   triamcinolone cream (KENALOG) 0.1 %, Apply 1 application topically 2 (two) times daily., Disp: 30 g, Rfl: 0  EXAM:  GENERAL: alert.  Sounds to be in no acute distress.  Answering questions appropriately.    PSYCH/NEURO: pleasant and cooperative, no obvious depression or anxiety, speech and thought processing grossly intact  ASSESSMENT AND PLAN:  Discussed the following assessment and plan:  Environmental allergies  Stress  Environmental allergies Discussed her symptoms and treatment.  flonase daily as directed.  Discussed taking claritin on a regular basis.  Follow symptoms.  Notify me if symptoms worsen.   Stress Discussed with her today.  She feels she is handling things relatively well.  Follow.      I discussed the assessment and treatment plan with the patient. The patient was provided an opportunity to ask questions and all were answered. The patient agreed with the plan and demonstrated an understanding of the instructions.   The patient was advised to call back or seek an in-person evaluation if the symptoms worsen or if the condition fails to improve as anticipated.  I provided 15 minutes of non-face-to-face time during this encounter.   Einar Pheasant, MD

## 2018-08-16 ENCOUNTER — Encounter: Payer: Self-pay | Admitting: Internal Medicine

## 2018-08-16 DIAGNOSIS — Z9109 Other allergy status, other than to drugs and biological substances: Secondary | ICD-10-CM | POA: Insufficient documentation

## 2018-08-16 NOTE — Assessment & Plan Note (Signed)
Discussed her symptoms and treatment.  flonase daily as directed.  Discussed taking claritin on a regular basis.  Follow symptoms.  Notify me if symptoms worsen.

## 2018-08-16 NOTE — Assessment & Plan Note (Signed)
Discussed with her today. She feels she is handling things relatively well.  Follow.   

## 2018-09-25 ENCOUNTER — Other Ambulatory Visit: Payer: Self-pay | Admitting: Internal Medicine

## 2018-09-29 ENCOUNTER — Other Ambulatory Visit: Payer: Self-pay

## 2018-09-29 MED ORDER — RALOXIFENE HCL 60 MG PO TABS: ORAL_TABLET | ORAL | 1 refills | Status: DC

## 2018-09-29 MED ORDER — BUPROPION HCL ER (SR) 150 MG PO TB12: 150.0000 mg | ORAL_TABLET | Freq: Every day | ORAL | 1 refills | Status: DC

## 2018-12-11 ENCOUNTER — Encounter: Payer: Self-pay | Admitting: Internal Medicine

## 2018-12-15 ENCOUNTER — Other Ambulatory Visit: Payer: Self-pay | Admitting: Internal Medicine

## 2018-12-15 DIAGNOSIS — Z1231 Encounter for screening mammogram for malignant neoplasm of breast: Secondary | ICD-10-CM

## 2018-12-23 ENCOUNTER — Other Ambulatory Visit: Payer: Self-pay

## 2018-12-25 ENCOUNTER — Encounter: Payer: Self-pay | Admitting: Internal Medicine

## 2018-12-25 ENCOUNTER — Other Ambulatory Visit: Payer: Self-pay

## 2018-12-25 ENCOUNTER — Ambulatory Visit (INDEPENDENT_AMBULATORY_CARE_PROVIDER_SITE_OTHER): Payer: Medicare HMO | Admitting: Internal Medicine

## 2018-12-25 VITALS — BP 120/70 | HR 90 | Temp 97.4°F | Resp 16 | Ht 62.0 in | Wt 121.6 lb

## 2018-12-25 DIAGNOSIS — R69 Illness, unspecified: Secondary | ICD-10-CM | POA: Diagnosis not present

## 2018-12-25 DIAGNOSIS — Z13220 Encounter for screening for lipoid disorders: Secondary | ICD-10-CM | POA: Diagnosis not present

## 2018-12-25 DIAGNOSIS — Z23 Encounter for immunization: Secondary | ICD-10-CM

## 2018-12-25 DIAGNOSIS — R300 Dysuria: Secondary | ICD-10-CM

## 2018-12-25 DIAGNOSIS — Z9109 Other allergy status, other than to drugs and biological substances: Secondary | ICD-10-CM

## 2018-12-25 DIAGNOSIS — Z0000 Encounter for general adult medical examination without abnormal findings: Secondary | ICD-10-CM

## 2018-12-25 DIAGNOSIS — M8580 Other specified disorders of bone density and structure, unspecified site: Secondary | ICD-10-CM | POA: Diagnosis not present

## 2018-12-25 DIAGNOSIS — E2839 Other primary ovarian failure: Secondary | ICD-10-CM | POA: Diagnosis not present

## 2018-12-25 DIAGNOSIS — F439 Reaction to severe stress, unspecified: Secondary | ICD-10-CM

## 2018-12-25 LAB — LIPID PANEL
Cholesterol: 193 mg/dL (ref 0–200)
HDL: 55.6 mg/dL (ref >39.00)
LDL Cholesterol: 108 mg/dL — ABNORMAL HIGH (ref 0–99)
NonHDL: 137.89
Total CHOL/HDL Ratio: 3
Triglycerides: 149 mg/dL (ref 0.0–149.0)
VLDL: 29.8 mg/dL (ref 0.0–40.0)

## 2018-12-25 LAB — URINALYSIS, ROUTINE W REFLEX MICROSCOPIC
Bilirubin Urine: NEGATIVE
Hgb urine dipstick: NEGATIVE
Ketones, ur: NEGATIVE
Leukocytes,Ua: NEGATIVE
Nitrite: NEGATIVE
RBC / HPF: NONE SEEN (ref 0–?)
Specific Gravity, Urine: 1.02 (ref 1.000–1.030)
Total Protein, Urine: NEGATIVE
Urine Glucose: NEGATIVE
Urobilinogen, UA: 0.2 (ref 0.0–1.0)
pH: 7 (ref 5.0–8.0)

## 2018-12-25 LAB — COMPREHENSIVE METABOLIC PANEL
ALT: 30 U/L (ref 0–35)
AST: 30 U/L (ref 0–37)
Albumin: 4.3 g/dL (ref 3.5–5.2)
Alkaline Phosphatase: 70 U/L (ref 39–117)
BUN: 19 mg/dL (ref 6–23)
CO2: 30 mEq/L (ref 19–32)
Calcium: 9.6 mg/dL (ref 8.4–10.5)
Chloride: 104 mEq/L (ref 96–112)
Creatinine, Ser: 1.09 mg/dL (ref 0.40–1.20)
GFR: 49.55 mL/min — ABNORMAL LOW (ref >60.00)
Glucose, Bld: 81 mg/dL (ref 70–99)
Potassium: 4.5 mEq/L (ref 3.5–5.1)
Sodium: 140 mEq/L (ref 135–145)
Total Bilirubin: 0.7 mg/dL (ref 0.2–1.2)
Total Protein: 6.5 g/dL (ref 6.0–8.3)

## 2018-12-25 NOTE — Progress Notes (Signed)
Patient ID: Yolanda Perkins, female   DOB: [birth year], 68 y.o.   MRN: [id number]   Subjective:    Patient ID: Yolanda Perkins, female    DOB: June 10, 1950, 68 y.o.   MRN: [id number]  HPI  Patient here for her physical exam.  She reports she is doing well.  Trying to stay active.  No chest pain.  No sob.  No acid reflux. No abdominal pain.  Bowels moving. Handling stress.  Has good support.  Does not feel needs any further intervention.  Mammogram is scheduled.     Past Medical History:  Diagnosis Date  . Chicken pox   . Depression   . Migraines   . Urinary tract bacterial infections    Past Surgical History:  Procedure Laterality Date  . [place]  . TONSILLECTOMY AND ADENOIDECTOMY  1954   Family History  Problem Relation Age of Onset  . Arthritis Mother        rheumatoid -which is in remission  . Osteoporosis Mother   . Transient ischemic attack Mother   . Congestive Heart Failure Mother   . Diverticulitis Mother   . Ovarian cancer Mother   . Macular degeneration Mother   . Heart disease Father        s/p bypass surgery  . Macular degeneration Father   . Breast cancer Maternal Aunt   . Uterine cancer Maternal Aunt   . Uterine cancer Maternal Grandmother   . Breast cancer Maternal Aunt   . Breast cancer Cousin 24   Social History   Socioeconomic History  . Marital status: Married    Spouse name: Not on file  . Number of children: Not on file  . Years of education: Not on file  . Highest education level: Not on file  Occupational History  . Not on file  Social Needs  . Financial resource strain: Not hard at all  . Food insecurity    Worry: Never true    Inability: Never true  . Transportation needs    Medical: No    Non-medical: No  Tobacco Use  . Smoking status: Never Smoker  . Smokeless tobacco: Never Used  Substance and Sexual Activity  . Alcohol use: No    Alcohol/week: 0.0 standard drinks  . Drug use: No  . Sexual activity: Not on file  Lifestyle   . Physical activity    Days per week: 7 days    Minutes per session: 30 min  . Stress: Not at all  Relationships  . Social Herbalist on phone: Not on file    Gets together: Not on file    Attends religious service: Not on file    Active member of club or organization: Not on file    Attends meetings of clubs or organizations: Not on file    Relationship status: Not on file  Other Topics Concern  . Not on file  Social History Narrative   She is married and has 2 daughters.    Outpatient Encounter Medications as of 12/25/2018  Medication Sig  . azelastine (ASTELIN) 0.1 % nasal spray Place 1 spray into both nostrils 2 (two) times daily. Use in each nostril as directed  . buPROPion (WELLBUTRIN SR) 150 MG 12 hr tablet Take 1 tablet (150 mg total) by mouth daily.  . Calcium Carbonate-Vit D-Min 600-400 MG-UNIT TABS Take 1 tablet by mouth 2 (two) times daily.  . Cholecalciferol (VITAMIN D3) 1000 UNITS CAPS Take 1  capsule by mouth daily.  Marland Kitchen estradiol (ESTRACE) 0.1 MG/GM vaginal cream Use as directed.  . meloxicam (MOBIC) 7.5 MG tablet Take 1 tablet (7.5 mg total) by mouth daily as needed for pain. With meals  . metaxalone (SKELAXIN) 800 MG tablet 1/2 - 1 tablet q pm prn.  . Misc Natural Products (GLUCOSAMINE-CHONDROITIN SULF) TABS Take 1 tablet by mouth daily.  . Multiple Vitamin (MULTIVITAMIN) tablet Take 1 tablet by mouth daily.  Marland Kitchen omeprazole (PRILOSEC) 20 MG capsule Take 1 capsule (20 mg total) by mouth daily.  Marland Kitchen pyridoxine (B-6) 100 MG tablet Take 100 mg by mouth daily.  . raloxifene (EVISTA) 60 MG tablet Take 1 tablet (60 mg total) by mouth daily  . SUMAtriptan (IMITREX) 50 MG tablet Take 1/2 tablet my mouth as  directed as needed.  . triamcinolone cream (KENALOG) 0.1 % Apply 1 application topically 2 (two) times daily.  . [DISCONTINUED] methylPREDNISolone (MEDROL DOSEPAK) 4 MG TBPK tablet Medrol dose pack - 6 day taper.  Take as directed.   No facility-administered  encounter medications on file as of 12/25/2018.    Review of Systems  Constitutional: Negative for appetite change and unexpected weight change.  HENT: Negative for congestion and sinus pressure.   Eyes: Negative for pain and visual disturbance.  Respiratory: Negative for cough, chest tightness and shortness of breath.   Cardiovascular: Negative for chest pain, palpitations and leg swelling.  Gastrointestinal: Negative for abdominal pain, diarrhea, nausea and vomiting.  Genitourinary: Negative for difficulty urinating and dysuria.  Musculoskeletal: Negative for joint swelling and myalgias.  Skin: Negative for color change and rash.  Neurological: Negative for dizziness, light-headedness and headaches.  Hematological: Negative for adenopathy. Does not bruise/bleed easily.  Psychiatric/Behavioral: Negative for agitation and dysphoric mood.       Objective:    Physical Exam Constitutional:      General: She is not in acute distress.    Appearance: Normal appearance. She is well-developed.  HENT:     Head: Normocephalic and atraumatic.     Right Ear: External ear normal.     Left Ear: External ear normal.  Eyes:     General: No scleral icterus.       Right eye: No discharge.        Left eye: No discharge.     Conjunctiva/sclera: Conjunctivae normal.  Neck:     Musculoskeletal: Neck supple. No muscular tenderness.     Thyroid: No thyromegaly.  Cardiovascular:     Rate and Rhythm: Normal rate and regular rhythm.  Pulmonary:     Effort: No tachypnea, accessory muscle usage or respiratory distress.     Breath sounds: Normal breath sounds. No decreased breath sounds or wheezing.  Chest:     Breasts:        Right: No inverted nipple, mass, nipple discharge or tenderness (no axillary adenopathy).        Left: No inverted nipple, mass, nipple discharge or tenderness (no axilarry adenopathy).  Abdominal:     General: Bowel sounds are normal.     Palpations: Abdomen is soft.      Tenderness: There is no abdominal tenderness.  Musculoskeletal:        General: No swelling or tenderness.  Lymphadenopathy:     Cervical: No cervical adenopathy.  Skin:    Findings: No erythema or rash.  Neurological:     Mental Status: She is alert and oriented to person, place, and time.  Psychiatric:  Mood and Affect: Mood normal.        Behavior: Behavior normal.     BP 120/70   Pulse 90   Temp (!) 97.4 F (36.3 C)   Resp 16   Wt 121 lb 9.6 oz (55.2 kg)   LMP 04/19/1999   SpO2 97%   BMI 22.24 kg/m  Wt Readings from Last 3 Encounters:  12/25/18 121 lb 9.6 oz (55.2 kg)  02/04/18 121 lb 6.4 oz (55.1 kg)  12/31/16 119 lb (54 kg)     Lab Results  Component Value Date   WBC 5.2 03/11/2018   HGB 13.5 03/11/2018   HCT 40.2 03/11/2018   PLT 220.0 03/11/2018   GLUCOSE 81 12/25/2018   CHOL 193 12/25/2018   TRIG 149.0 12/25/2018   HDL 55.60 12/25/2018   LDLCALC 108 (H) 12/25/2018   ALT 30 12/25/2018   AST 30 12/25/2018   NA 140 12/25/2018   K 4.5 12/25/2018   CL 104 12/25/2018   CREATININE 1.09 12/25/2018   BUN 19 12/25/2018   CO2 30 12/25/2018   TSH 1.11 03/11/2018    Mm 3d Screen Breast Bilateral  Result Date: 11/29/2017 CLINICAL DATA:  Screening. EXAM: DIGITAL SCREENING BILATERAL MAMMOGRAM WITH TOMO AND CAD COMPARISON:  Previous exam(s). ACR Breast Density Category b: There are scattered areas of fibroglandular density. FINDINGS: There are no findings suspicious for malignancy. Images were processed with CAD. IMPRESSION: No mammographic evidence of malignancy. A result letter of this screening mammogram will be mailed directly to the patient. RECOMMENDATION: Screening mammogram in one year. (Code:SM-B-01Y) BI-RADS CATEGORY  1: Negative. Electronically Signed   By: Claudie Revering M.D.   On: 11/29/2017 09:32       Assessment & Plan:   Problem List Items Addressed This Visit    Environmental allergies    Controlled.       Health care maintenance     Physical today 12/25/18.  Mammogram 11/29/17 - [facility].  Scheduled for mammogram 01/19/19.  Colonoscopy 10/2014.  Recommended f/u in 10 years.  Agreeable for bone density.        Osteopenia - Primary    On evista.  Schedule f/u bone density.        Relevant Orders   Comprehensive metabolic panel (Completed)   Stress    Handling things well.  Follow.         Other Visit Diagnoses    Screening cholesterol level       Relevant Orders   Lipid panel (Completed)   Dysuria       Relevant Orders   Urinalysis, Routine w reflex microscopic (Completed)   Urine Culture (Completed)   Estrogen deficiency       Relevant Orders   DG Bone Density   Need for immunization against influenza       Relevant Orders   Flu Vaccine QUAD High Dose(Fluad) (Completed)       Einar Pheasant, MD

## 2018-12-25 NOTE — Assessment & Plan Note (Addendum)
Physical today 12/25/18.  Mammogram 11/29/17 - [facility].  Scheduled for mammogram 01/19/19.  Colonoscopy 10/2014.  Recommended f/u in 10 years.  Agreeable for bone density.

## 2018-12-26 ENCOUNTER — Other Ambulatory Visit: Payer: Self-pay | Admitting: Internal Medicine

## 2018-12-26 DIAGNOSIS — R944 Abnormal results of kidney function studies: Secondary | ICD-10-CM

## 2018-12-26 LAB — URINE CULTURE
MICRO NUMBER:: 918382
Result:: NO GROWTH
SPECIMEN QUALITY:: ADEQUATE

## 2018-12-26 NOTE — Progress Notes (Signed)
Order placed for f/u lab.   

## 2018-12-28 ENCOUNTER — Encounter: Payer: Self-pay | Admitting: Internal Medicine

## 2018-12-28 NOTE — Assessment & Plan Note (Signed)
Controlled.  

## 2018-12-28 NOTE — Assessment & Plan Note (Signed)
Handling things well.  Follow.   

## 2018-12-28 NOTE — Assessment & Plan Note (Signed)
On evista.  Schedule f/u bone density.

## 2018-12-29 ENCOUNTER — Encounter: Payer: Self-pay | Admitting: Internal Medicine

## 2018-12-29 ENCOUNTER — Other Ambulatory Visit: Payer: Self-pay | Admitting: Internal Medicine

## 2018-12-31 ENCOUNTER — Other Ambulatory Visit: Payer: Self-pay

## 2018-12-31 MED ORDER — ROSUVASTATIN CALCIUM 10 MG PO TABS: 10.0000 mg | ORAL_TABLET | Freq: Every day | ORAL | 1 refills | Status: DC

## 2019-01-02 ENCOUNTER — Telehealth: Payer: Self-pay

## 2019-01-02 NOTE — Telephone Encounter (Signed)
Copied from [place] [id number]. Topic: General - Inquiry >> Jan 02, 2019 11:43 AM Scherrie Gerlach wrote: Reason for CRM: pt states she got a call from [facility] after speaking with her and making lab appt.  But I do not see any notes. Please call back if you need, as she has been out of town

## 2019-01-05 NOTE — Telephone Encounter (Signed)
See result note.  

## 2019-01-08 ENCOUNTER — Other Ambulatory Visit (INDEPENDENT_AMBULATORY_CARE_PROVIDER_SITE_OTHER): Payer: Medicare HMO

## 2019-01-08 ENCOUNTER — Other Ambulatory Visit: Payer: Self-pay

## 2019-01-08 ENCOUNTER — Encounter: Payer: Self-pay | Admitting: Internal Medicine

## 2019-01-08 DIAGNOSIS — R944 Abnormal results of kidney function studies: Secondary | ICD-10-CM

## 2019-01-08 LAB — BASIC METABOLIC PANEL
BUN: 17 mg/dL (ref 6–23)
CO2: 31 mEq/L (ref 19–32)
Calcium: 9.6 mg/dL (ref 8.4–10.5)
Chloride: 102 mEq/L (ref 96–112)
Creatinine, Ser: 1 mg/dL (ref 0.40–1.20)
GFR: 54.72 mL/min — ABNORMAL LOW (ref >60.00)
Glucose, Bld: 81 mg/dL (ref 70–99)
Potassium: 4.4 mEq/L (ref 3.5–5.1)
Sodium: 139 mEq/L (ref 135–145)

## 2019-01-19 ENCOUNTER — Ambulatory Visit
Admission: RE | Admit: 2019-01-19 | Discharge: 2019-01-19 | Disposition: A | Discharge status: Home / Self Care | Payer: Medicare HMO | Source: Ambulatory Visit | Attending: Internal Medicine | Admitting: Internal Medicine

## 2019-01-19 DIAGNOSIS — Z1231 Encounter for screening mammogram for malignant neoplasm of breast: Secondary | ICD-10-CM | POA: Insufficient documentation

## 2019-01-19 DIAGNOSIS — M85852 Other specified disorders of bone density and structure, left thigh: Secondary | ICD-10-CM | POA: Diagnosis not present

## 2019-01-19 DIAGNOSIS — E2839 Other primary ovarian failure: Secondary | ICD-10-CM

## 2019-02-09 ENCOUNTER — Other Ambulatory Visit: Payer: Self-pay

## 2019-02-10 ENCOUNTER — Telehealth: Payer: Self-pay | Admitting: *Deleted

## 2019-02-10 DIAGNOSIS — R944 Abnormal results of kidney function studies: Secondary | ICD-10-CM

## 2019-02-10 DIAGNOSIS — E7800 Pure hypercholesterolemia, unspecified: Secondary | ICD-10-CM

## 2019-02-10 NOTE — Telephone Encounter (Signed)
Please place future orders for lab appt.  

## 2019-02-10 NOTE — Telephone Encounter (Signed)
Orders placed for f/u labs.  

## 2019-02-11 ENCOUNTER — Encounter: Payer: Self-pay | Admitting: Internal Medicine

## 2019-02-11 ENCOUNTER — Other Ambulatory Visit (INDEPENDENT_AMBULATORY_CARE_PROVIDER_SITE_OTHER): Payer: Medicare HMO

## 2019-02-11 ENCOUNTER — Other Ambulatory Visit: Payer: Self-pay

## 2019-02-11 DIAGNOSIS — R944 Abnormal results of kidney function studies: Secondary | ICD-10-CM

## 2019-02-11 DIAGNOSIS — E7800 Pure hypercholesterolemia, unspecified: Secondary | ICD-10-CM | POA: Diagnosis not present

## 2019-02-11 LAB — BASIC METABOLIC PANEL
BUN: 18 mg/dL (ref 6–23)
CO2: 29 mEq/L (ref 19–32)
Calcium: 8.7 mg/dL (ref 8.4–10.5)
Chloride: 105 mEq/L (ref 96–112)
Creatinine, Ser: 1.01 mg/dL (ref 0.40–1.20)
GFR: 54.08 mL/min — ABNORMAL LOW (ref >60.00)
Glucose, Bld: 94 mg/dL (ref 70–99)
Potassium: 4.2 mEq/L (ref 3.5–5.1)
Sodium: 138 mEq/L (ref 135–145)

## 2019-02-11 LAB — HEPATIC FUNCTION PANEL
ALT: 18 U/L (ref 0–35)
AST: 25 U/L (ref 0–37)
Albumin: 3.9 g/dL (ref 3.5–5.2)
Alkaline Phosphatase: 65 U/L (ref 39–117)
Bilirubin, Direct: 0.1 mg/dL (ref 0.0–0.3)
Total Bilirubin: 0.4 mg/dL (ref 0.2–1.2)
Total Protein: 6.2 g/dL (ref 6.0–8.3)

## 2019-03-06 ENCOUNTER — Encounter: Payer: Self-pay | Admitting: Internal Medicine

## 2019-03-09 NOTE — Telephone Encounter (Signed)
Would you like for patient to be tested or just continue to quarantine with husband as she stated below since they were exposed together?

## 2019-03-09 NOTE — Telephone Encounter (Signed)
It would be more for diagnostic purposes.  If she wants to know, can test.  She will need to continue self quarantine for 2 weeks from his positive test date.    Dr Nicki Reaper

## 2019-03-30 ENCOUNTER — Other Ambulatory Visit: Payer: Self-pay | Admitting: Internal Medicine

## 2019-04-06 ENCOUNTER — Telehealth: Payer: Medicare HMO | Admitting: Family

## 2019-04-06 DIAGNOSIS — W548XXA Other contact with dog, initial encounter: Secondary | ICD-10-CM

## 2019-04-06 DIAGNOSIS — L03119 Cellulitis of unspecified part of limb: Secondary | ICD-10-CM

## 2019-04-06 MED ORDER — SULFAMETHOXAZOLE-TRIMETHOPRIM 800-160 MG PO TABS: 1.0000 | ORAL_TABLET | Freq: Two times a day (BID) | ORAL | 0 refills | Status: DC

## 2019-04-06 NOTE — Progress Notes (Signed)
E Visit for Cellulitis ° °We are sorry that you are not feeling well. Here is how we plan to help! ° °Based on what you shared with me it looks like you have cellulitis.  Cellulitis looks like areas of skin redness, swelling, and warmth; it develops as a result of bacteria entering under the skin. Little red spots and/or bleeding can be seen in skin, and tiny surface sacs containing fluid can occur. Fever can be present. Cellulitis is almost always on one side of a body, and the lower limbs are the most common site of involvement.  ° °I have prescribed:  Bactrim DS 1 tablet by mouth twice a day for 7 days. ° °HOME CARE: ° °Take your medications as ordered and take all of them, even if the skin irritation appears to be healing.  ° °GET HELP RIGHT AWAY IF: ° °Symptoms that don't begin to go away within 48 hours. °Severe redness persists or worsens °If the area turns color, spreads or swells. °If it blisters and opens, develops yellow-brown crust or bleeds. °You develop a fever or chills. °If the pain increases or becomes unbearable.  °Are unable to keep fluids and food down. ° °MAKE SURE YOU  ° °Understand these instructions. °Will watch your condition. °Will get help right away if you are not doing well or get worse. ° °Thank you for choosing an e-visit. ° °Your e-visit answers were reviewed by a board certified advanced clinical practitioner to complete your personal care plan. Depending upon the condition, your plan could have included both over the counter or prescription medications. ° °Please review your pharmacy choice. Make sure the pharmacy is open so you can pick up prescription now. If there is a problem, you may contact your provider through MyChart messaging and have the prescription routed to another pharmacy.  Your safety is important to us. If you have drug allergies check your prescription carefully.  ° °For the next 24 hours you can use MyChart to ask questions about today's visit, request a  non-urgent call back, or ask for a work or school excuse. °You will get an email in the next two days asking about your experience. I hope that your e-visit has been valuable and will speed your recovery. ° °Approximately 5 minutes was spent documenting and reviewing patient's chart.  ° ° °

## 2019-06-25 ENCOUNTER — Ambulatory Visit: Payer: Medicare HMO | Admitting: Internal Medicine

## 2019-06-25 ENCOUNTER — Other Ambulatory Visit: Payer: Self-pay

## 2019-06-25 DIAGNOSIS — M25561 Pain in right knee: Secondary | ICD-10-CM

## 2019-06-25 DIAGNOSIS — Z9109 Other allergy status, other than to drugs and biological substances: Secondary | ICD-10-CM

## 2019-06-25 DIAGNOSIS — F439 Reaction to severe stress, unspecified: Secondary | ICD-10-CM

## 2019-06-25 DIAGNOSIS — Z13220 Encounter for screening for lipoid disorders: Secondary | ICD-10-CM

## 2019-06-25 DIAGNOSIS — R519 Headache, unspecified: Secondary | ICD-10-CM

## 2019-06-25 DIAGNOSIS — M25562 Pain in left knee: Secondary | ICD-10-CM

## 2019-06-25 MED ORDER — SUMATRIPTAN SUCCINATE 50 MG PO TABS: ORAL_TABLET | ORAL | 0 refills | Status: DC

## 2019-06-25 NOTE — Progress Notes (Signed)
Patient ID: Yolanda Perkins, female   DOB: [birth year], 69 y.o.   MRN: [id number]   Subjective:    Patient ID: Yolanda Perkins, female    DOB: 1950/04/30, 69 y.o.   MRN: [id number]  HPI This visit occurred during the SARS-CoV-2 public health emergency.  Safety protocols were in place, including screening questions prior to the visit, additional usage of staff PPE, and extensive cleaning of exam room while observing appropriate contact time as indicated for disinfecting solutions.  Patient here for a scheduled follow up.  She reports she is doing relatively well.  Has retired.  Husband had covid 03/2019.  Doing better.  She is having issues with her knee.  Trying to limit meloxicam use.  Takes 1/2 tablet qod.  Takes tylenol at night.  Does not limit her walking, activity.  She is off wellbutrin.  Feels she is dong well off the medication.  No chest pain.  No sob reported.  No abdominal pain or bowel change reported.  Did not start crestor.  Request refill on imitrex.  Rarely takes.  If she does not sleep well, this will trigger.  Overall doing well.    Past Medical History:  Diagnosis Date   Chicken pox    Depression    Migraines    Urinary tract bacterial infections    Past Surgical History:  Procedure Laterality Date   HERNIA REPAIR  1962   TONSILLECTOMY AND ADENOIDECTOMY  1954   Family History  Problem Relation Age of Onset   Arthritis Mother        rheumatoid -which is in remission   Osteoporosis Mother    Transient ischemic attack Mother    Congestive Heart Failure Mother    Diverticulitis Mother    Ovarian cancer Mother    Macular degeneration Mother    Heart disease Father        s/p bypass surgery   Macular degeneration Father    Breast cancer Maternal Aunt    Uterine cancer Maternal Aunt    Uterine cancer Maternal Grandmother    Breast cancer Maternal Aunt    Breast cancer Cousin 62   Social History   Socioeconomic History   Marital status: Married   Spouse name: Not on file   Number of children: Not on file   Years of education: Not on file   Highest education level: Not on file  Occupational History   Not on file  Tobacco Use   Smoking status: Never Smoker   Smokeless tobacco: Never Used  Substance and Sexual Activity   Alcohol use: No    Alcohol/week: 0.0 standard drinks   Drug use: No   Sexual activity: Not on file  Other Topics Concern   Not on file  Social History Narrative   She is married and has 2 daughters.   Social Determinants of Health   Financial Resource Strain: Low Risk    Difficulty of Paying Living Expenses: Not hard at all  Food Insecurity: No Food Insecurity   Worried About Charity fundraiser in the Last Year: Never true   [place] in the Last Year: Never true  Transportation Needs: No Transportation Needs   Lack of Transportation (Medical): No   Lack of Transportation (Non-Medical): No  Physical Activity: Sufficiently Active   Days of Exercise per Week: 7 days   Minutes of Exercise per Session: 30 min  Stress: No Stress Concern Present   Feeling of Stress : Not at all  Social Connections:    Frequency of Communication with Friends and Family:    Frequency of Social Gatherings with Friends and Family:    Attends Religious Services:    Active Member of Clubs or Organizations:    Attends Archivist Meetings:    Marital Status:     Outpatient Encounter Medications as of 06/25/2019  Medication Sig   azelastine (ASTELIN) 0.1 % nasal spray Place 1 spray into both nostrils 2 (two) times daily. Use in each nostril as directed   Calcium Carbonate-Vit D-Min 600-400 MG-UNIT TABS Take 1 tablet by mouth 2 (two) times daily.   Cholecalciferol (VITAMIN D3) 1000 UNITS CAPS Take 1 capsule by mouth daily.   estradiol (ESTRACE) 0.1 MG/GM vaginal cream Use as directed.   meloxicam (MOBIC) 7.5 MG tablet Take 1 tablet (7.5 mg total) by mouth daily as needed for pain.  With meals   metaxalone (SKELAXIN) 800 MG tablet 1/2 - 1 tablet q pm prn.   Misc Natural Products (GLUCOSAMINE-CHONDROITIN SULF) TABS Take 1 tablet by mouth daily.   Multiple Vitamin (MULTIVITAMIN) tablet Take 1 tablet by mouth daily.   pyridoxine (B-6) 100 MG tablet Take 100 mg by mouth daily.   raloxifene (EVISTA) 60 MG tablet Take 1 tablet (60 mg total) by mouth daily   SUMAtriptan (IMITREX) 50 MG tablet Take 1/2 tablet my mouth as  directed as needed.   triamcinolone cream (KENALOG) 0.1 % Apply 1 application topically 2 (two) times daily.   [DISCONTINUED] omeprazole (PRILOSEC) 20 MG capsule Take 1 capsule (20 mg total) by mouth daily.   [DISCONTINUED] SUMAtriptan (IMITREX) 50 MG tablet Take 1/2 tablet my mouth as  directed as needed.   [DISCONTINUED] buPROPion (WELLBUTRIN SR) 150 MG 12 hr tablet Take 1 tablet (150 mg total) by mouth daily.   [DISCONTINUED] rosuvastatin (CRESTOR) 10 MG tablet Take 1 tablet (10 mg total) by mouth daily.   [DISCONTINUED] sulfamethoxazole-trimethoprim (BACTRIM DS) 800-160 MG tablet Take 1 tablet by mouth 2 (two) times daily.   No facility-administered encounter medications on file as of 06/25/2019.    Review of Systems  Constitutional: Negative for appetite change and unexpected weight change.  HENT: Negative for congestion and sinus pressure.   Respiratory: Negative for cough, chest tightness and shortness of breath.   Cardiovascular: Negative for chest pain and palpitations.  Gastrointestinal: Negative for abdominal pain, diarrhea, nausea and vomiting.  Genitourinary: Negative for difficulty urinating and dysuria.  Musculoskeletal: Negative for back pain and myalgias.       Some knee pain as outlined.    Skin: Negative for color change and rash.  Neurological: Negative for dizziness, light-headedness and headaches.  Psychiatric/Behavioral: Negative for agitation and dysphoric mood.       Objective:    Physical Exam Constitutional:       General: She is not in acute distress.    Appearance: Normal appearance.  HENT:     Head: Normocephalic and atraumatic.     Right Ear: External ear normal.     Left Ear: External ear normal.  Eyes:     General: No scleral icterus.       Right eye: No discharge.        Left eye: No discharge.     Conjunctiva/sclera: Conjunctivae normal.  Neck:     Thyroid: No thyromegaly.  Cardiovascular:     Rate and Rhythm: Normal rate and regular rhythm.  Pulmonary:     Effort: No respiratory distress.     Breath sounds:  Normal breath sounds. No wheezing.  Abdominal:     General: Bowel sounds are normal.     Palpations: Abdomen is soft.     Tenderness: There is no abdominal tenderness.  Musculoskeletal:        General: No swelling or tenderness.     Cervical back: Neck supple. No tenderness.  Lymphadenopathy:     Cervical: No cervical adenopathy.  Skin:    Findings: No erythema or rash.  Neurological:     Mental Status: She is alert.  Psychiatric:        Mood and Affect: Mood normal.        Behavior: Behavior normal.     BP 122/78    Pulse 74    Temp (!) 97.5 F (36.4 C)    Resp 16    Ht 5\' 2"  (1.575 m)    Wt 117 lb 3.2 oz (53.2 kg)    LMP 04/19/1999    SpO2 99%    BMI 21.44 kg/m  Wt Readings from Last 3 Encounters:  06/25/19 117 lb 3.2 oz (53.2 kg)  12/25/18 121 lb 9.6 oz (55.2 kg)  02/04/18 121 lb 6.4 oz (55.1 kg)     Lab Results  Component Value Date   WBC 5.2 03/11/2018   HGB 13.5 03/11/2018   HCT 40.2 03/11/2018   PLT 220.0 03/11/2018   GLUCOSE 94 02/11/2019   CHOL 193 12/25/2018   TRIG 149.0 12/25/2018   HDL 55.60 12/25/2018   LDLCALC 108 (H) 12/25/2018   ALT 18 02/11/2019   AST 25 02/11/2019   NA 138 02/11/2019   K 4.2 02/11/2019   CL 105 02/11/2019   CREATININE 1.01 02/11/2019   BUN 18 02/11/2019   CO2 29 02/11/2019   TSH 1.11 03/11/2018    DG Bone Density  Result Date: 01/19/2019 EXAM: DUAL X-RAY ABSORPTIOMETRY (DXA) FOR BONE MINERAL DENSITY  IMPRESSION: Technologist: [facility] Your patient Mariany Gessler completed a BMD test on 01/19/2019 using the [place] (analysis version: 14.10) manufactured by [employer]. The following summarizes the results of our evaluation. PATIENT BIOGRAPHICAL: Name: Decklyn, Senter Patient ID: [id number] Birth Date: November 18, 1950 Height: 62.0 in. Gender: Female Exam Date: 01/19/2019 Weight: 121.2 lbs. Indications: Advanced Age, Caucasian, left knee replacement, Postmenopausal Fractures: Treatments: Calcium, Evista, Vitamin D ASSESSMENT: The BMD measured at Femur Neck Left is 0.732 g/cm2 with a T-score of -2.2. This patient is considered osteopenic according to [facility]) criteria. Lumbar spine was not utilized due to advanced degenerative changes. Patient is not a candidate for FRAX due to Evista. The scan quality is good. Site Region Measured Measured WHO Young Adult BMD Date       Age      Classification T-score DualFemur Neck Left 01/19/2019 70.5 Osteopenia -2.2 0.732 g/cm2 Left Forearm Radius 33% 01/19/2019 70.5 Osteopenia -2.1 0.688 g/cm2 World Health Organization [facility]) criteria for post-menopausal, Caucasian Women: Normal:       T-score at or above -1 SD Osteopenia:   T-score between -1 and -2.5 SD Osteoporosis: T-score at or below -2.5 SD RECOMMENDATIONS: 1. All patients should optimize calcium and vitamin D intake. 2. Consider FDA-approved medical therapies in postmenopausal women and men aged 64 years and older, based on the following: a. A hip or vertebral(clinical or morphometric) fracture b. T-score < -2.5 at the femoral neck or spine after appropriate evaluation to exclude secondary causes c. Low bone mass (T-score between -1.0 and -2.5 at the femoral neck or spine) and a  10-year probability of a hip fracture > 3% or a 10-year probability of a major osteoporosis-related fracture > 20% based on the [place]-adapted WHO algorithm d. Clinician judgment and/or patient preferences may indicate treatment  for people with 10-year fracture probabilities above or below these levels FOLLOW-UP: People with diagnosed cases of osteoporosis or at high risk for fracture should have regular bone mineral density tests. For patients eligible for Medicare, routine testing is allowed once every 2 years. The testing frequency can be increased to one year for patients who have rapidly progressing disease, those who are receiving or discontinuing medical therapy to restore bone mass, or have additional risk factors. I have reviewed this report, and agree with the above findings. [facility] Radiology Electronically Signed   By: Dorise Bullion III M.D   On: 01/19/2019 11:21   MM 3D SCREEN BREAST BILATERAL  Result Date: 01/19/2019 CLINICAL DATA:  Screening. EXAM: DIGITAL SCREENING BILATERAL MAMMOGRAM WITH TOMO AND CAD COMPARISON:  Previous exam(s). ACR Breast Density Category b: There are scattered areas of fibroglandular density. FINDINGS: There are no findings suspicious for malignancy. Images were processed with CAD. IMPRESSION: No mammographic evidence of malignancy. A result letter of this screening mammogram will be mailed directly to the patient. RECOMMENDATION: Screening mammogram in one year. (Code:SM-B-01Y) BI-RADS CATEGORY  1: Negative. Electronically Signed   By: Dorise Bullion III M.D   On: 01/19/2019 17:44       Assessment & Plan:   Problem List Items Addressed This Visit    Environmental allergies    Stable.  Controlled.        Headache    Rarely occurs.  Lack of sleep can trigger.  Request refill imitrex to have if needed.  Rarely uses.        Relevant Medications   SUMAtriptan (IMITREX) 50 MG tablet   Other Relevant Orders   CBC with Differential/Platelet   Comprehensive metabolic panel   TSH   Knee pain    Has seen ortho.  Still some knee discomfort at times.  Overall stable.  Takes meloxicam 1/2 tablet qod.  Trying to minimize amount taking.  Does not limit activity.  Wants to follow.         Screening cholesterol level    Have discussed calculated cholesterol risk and recommendation to start crestor.  She did not start.  Follow lipid panel.       Relevant Orders   Lipid panel   Stress    Has retired.  Off wellbutrin.  Doing well.  Follow.            Einar Pheasant, MD

## 2019-06-30 ENCOUNTER — Other Ambulatory Visit: Payer: Self-pay | Admitting: Internal Medicine

## 2019-07-05 ENCOUNTER — Encounter: Payer: Self-pay | Admitting: Internal Medicine

## 2019-07-05 DIAGNOSIS — Z13220 Encounter for screening for lipoid disorders: Secondary | ICD-10-CM | POA: Insufficient documentation

## 2019-07-05 DIAGNOSIS — R519 Headache, unspecified: Secondary | ICD-10-CM | POA: Insufficient documentation

## 2019-07-05 NOTE — Assessment & Plan Note (Signed)
Have discussed calculated cholesterol risk and recommendation to start crestor.  She did not start.  Follow lipid panel.

## 2019-07-05 NOTE — Assessment & Plan Note (Signed)
Rarely occurs.  Lack of sleep can trigger.  Request refill imitrex to have if needed.  Rarely uses.

## 2019-07-05 NOTE — Assessment & Plan Note (Signed)
Has seen ortho.  Still some knee discomfort at times.  Overall stable.  Takes meloxicam 1/2 tablet qod.  Trying to minimize amount taking.  Does not limit activity.  Wants to follow.

## 2019-07-05 NOTE — Assessment & Plan Note (Signed)
Stable. Controlled. 

## 2019-07-05 NOTE — Assessment & Plan Note (Signed)
Has retired.  Off wellbutrin.  Doing well.  Follow.

## 2019-07-09 ENCOUNTER — Other Ambulatory Visit: Payer: Self-pay

## 2019-07-09 ENCOUNTER — Other Ambulatory Visit (INDEPENDENT_AMBULATORY_CARE_PROVIDER_SITE_OTHER): Payer: Medicare HMO

## 2019-07-09 DIAGNOSIS — R519 Headache, unspecified: Secondary | ICD-10-CM

## 2019-07-09 DIAGNOSIS — Z13220 Encounter for screening for lipoid disorders: Secondary | ICD-10-CM | POA: Diagnosis not present

## 2019-07-09 LAB — COMPREHENSIVE METABOLIC PANEL
ALT: 21 U/L (ref 0–35)
AST: 27 U/L (ref 0–37)
Albumin: 4.1 g/dL (ref 3.5–5.2)
Alkaline Phosphatase: 80 U/L (ref 39–117)
BUN: 23 mg/dL (ref 6–23)
CO2: 31 mEq/L (ref 19–32)
Calcium: 9.4 mg/dL (ref 8.4–10.5)
Chloride: 104 mEq/L (ref 96–112)
Creatinine, Ser: 1.02 mg/dL (ref 0.40–1.20)
GFR: 53.41 mL/min — ABNORMAL LOW (ref >60.00)
Glucose, Bld: 93 mg/dL (ref 70–99)
Potassium: 4.2 mEq/L (ref 3.5–5.1)
Sodium: 140 mEq/L (ref 135–145)
Total Bilirubin: 0.6 mg/dL (ref 0.2–1.2)
Total Protein: 6.5 g/dL (ref 6.0–8.3)

## 2019-07-09 LAB — CBC WITH DIFFERENTIAL/PLATELET
Basophils Absolute: 0.1 10*3/uL (ref 0.0–0.1)
Basophils Relative: 1 % (ref 0.0–3.0)
Eosinophils Absolute: 0.2 10*3/uL (ref 0.0–0.7)
Eosinophils Relative: 4 % (ref 0.0–5.0)
HCT: 39.4 % (ref 36.0–46.0)
Hemoglobin: 13.2 g/dL (ref 12.0–15.0)
Lymphocytes Relative: 26.4 % (ref 12.0–46.0)
Lymphs Abs: 1.4 10*3/uL (ref 0.7–4.0)
MCHC: 33.5 g/dL (ref 30.0–36.0)
MCV: 92.6 fl (ref 78.0–100.0)
Monocytes Absolute: 0.4 10*3/uL (ref 0.1–1.0)
Monocytes Relative: 6.9 % (ref 3.0–12.0)
Neutro Abs: 3.2 10*3/uL (ref 1.4–7.7)
Neutrophils Relative %: 61.7 % (ref 43.0–77.0)
Platelets: 261 10*3/uL (ref 150.0–400.0)
RBC: 4.26 Mil/uL (ref 3.87–5.11)
RDW: 13 % (ref 11.5–15.5)
WBC: 5.3 10*3/uL (ref 4.0–10.5)

## 2019-07-09 LAB — LIPID PANEL
Cholesterol: 180 mg/dL (ref 0–200)
HDL: 60 mg/dL (ref >39.00)
LDL Cholesterol: 102 mg/dL — ABNORMAL HIGH (ref 0–99)
NonHDL: 119.88
Total CHOL/HDL Ratio: 3
Triglycerides: 88 mg/dL (ref 0.0–149.0)
VLDL: 17.6 mg/dL (ref 0.0–40.0)

## 2019-07-09 LAB — TSH: TSH: 1.53 u[IU]/mL (ref 0.35–4.50)

## 2019-07-24 ENCOUNTER — Ambulatory Visit (INDEPENDENT_AMBULATORY_CARE_PROVIDER_SITE_OTHER): Payer: Medicare HMO

## 2019-07-24 VITALS — Ht 62.0 in | Wt 117.0 lb

## 2019-07-24 DIAGNOSIS — Z0000 Encounter for general adult medical examination without abnormal findings: Secondary | ICD-10-CM | POA: Diagnosis not present

## 2019-07-24 NOTE — Patient Instructions (Addendum)
  Ms. Fazel , Thank you for taking time to come for your Medicare Wellness Visit. I appreciate your ongoing commitment to your health goals. Please review the following plan we discussed and let me know if I can assist you in the future.   These are the goals we discussed: Goals      Patient Stated   . Increase physical activity (pt-stated)     Increase walking, yoga, swim, kayak for exercise       This is a list of the screening recommended for you and due dates:  Health Maintenance  Topic Date Due  . Mammogram  01/19/2020  . Tetanus Vaccine  09/01/2023  . Colon Cancer Screening  10/19/2024  . DEXA scan (bone density measurement)  Completed  . COVID-19 Vaccine  Completed  .  Hepatitis C: One time screening is recommended by Center for Disease Control  (CDC) for  adults born from [birth year] through 1967.   Completed  . Pneumonia vaccines  Completed  . Flu Shot  Discontinued

## 2019-07-24 NOTE — Progress Notes (Addendum)
Subjective:   Yolanda Perkins is a 69 y.o. female who presents for Medicare Annual (Subsequent) preventive examination.  Review of Systems:  No ROS.  Medicare Wellness Virtual Visit.  Visual/audio telehealth visit, UTA vital signs.   Ht/Wt provided. See social history for additional risk factors.   Cardiac Risk Factors include: advanced age (>64men, >73 women)     Objective:     Vitals: Ht 5\' 2"  (1.575 m)   Wt 117 lb (53.1 kg)   LMP 04/19/1999   BMI 21.40 kg/m   Body mass index is 21.4 kg/m.  Advanced Directives 07/24/2019 07/23/2018  Does Patient Have a Medical Advance Directive? Yes No  Does patient want to make changes to medical advance directive? No - Patient declined -  Would patient like information on creating a medical advance directive? - No - Patient declined    Tobacco Social History   Tobacco Use  Smoking Status Never Smoker  Smokeless Tobacco Never Used     Counseling given: Not Answered   Clinical Intake:  Pre-visit preparation completed: Yes        Diabetes: No  How often do you need to have someone help you when you read instructions, pamphlets, or other written materials from your doctor or pharmacy?: 1 - Never  Interpreter Needed?: No     Past Medical History:  Diagnosis Date  . Chicken pox   . Depression   . Migraines   . Urinary tract bacterial infections    Past Surgical History:  Procedure Laterality Date  . [place]  . TONSILLECTOMY AND ADENOIDECTOMY  1954   Family History  Problem Relation Age of Onset  . Arthritis Mother        rheumatoid -which is in remission  . Osteoporosis Mother   . Transient ischemic attack Mother   . Congestive Heart Failure Mother   . Diverticulitis Mother   . Ovarian cancer Mother   . Macular degeneration Mother   . Heart disease Father        s/p bypass surgery  . Macular degeneration Father   . Breast cancer Maternal Aunt   . Uterine cancer Maternal Aunt   . Uterine cancer  Maternal Grandmother   . Breast cancer Maternal Aunt   . Breast cancer Cousin 8   Social History   Socioeconomic History  . Marital status: Married    Spouse name: Not on file  . Number of children: Not on file  . Years of education: Not on file  . Highest education level: Not on file  Occupational History  . Not on file  Tobacco Use  . Smoking status: Never Smoker  . Smokeless tobacco: Never Used  Substance and Sexual Activity  . Alcohol use: No    Alcohol/week: 0.0 standard drinks  . Drug use: No  . Sexual activity: Not on file  Other Topics Concern  . Not on file  Social History Narrative   She is married and has 2 daughters.   Social Determinants of Health   Financial Resource Strain:   . Difficulty of Paying Living Expenses:   Food Insecurity:   . Worried About Charity fundraiser in the Last Year:   . Arboriculturist in the Last Year:   Transportation Needs:   . Film/video editor (Medical):   Marland Kitchen Lack of Transportation (Non-Medical):   Physical Activity:   . Days of Exercise per Week:   . Minutes of Exercise per Session:  Stress:   . Feeling of Stress :   Social Connections:   . Frequency of Communication with Friends and Family:   . Frequency of Social Gatherings with Friends and Family:   . Attends Religious Services:   . Active Member of Clubs or Organizations:   . Attends Archivist Meetings:   Marland Kitchen Marital Status:     Outpatient Encounter Medications as of 07/24/2019  Medication Sig  . azelastine (ASTELIN) 0.1 % nasal spray Place 1 spray into both nostrils 2 (two) times daily. Use in each nostril as directed  . Calcium Carbonate-Vit D-Min 600-400 MG-UNIT TABS Take 1 tablet by mouth 2 (two) times daily.  . Cholecalciferol (VITAMIN D3) 1000 UNITS CAPS Take 1 capsule by mouth daily.  Marland Kitchen estradiol (ESTRACE) 0.1 MG/GM vaginal cream Use as directed.  . meloxicam (MOBIC) 7.5 MG tablet Take 1 tablet (7.5 mg total) by mouth daily as needed for  pain. With meals  . metaxalone (SKELAXIN) 800 MG tablet 1/2 - 1 tablet q pm prn.  . Misc Natural Products (GLUCOSAMINE-CHONDROITIN SULF) TABS Take 1 tablet by mouth daily.  . Multiple Vitamin (MULTIVITAMIN) tablet Take 1 tablet by mouth daily.  Marland Kitchen omeprazole (PRILOSEC) 20 MG capsule Take 1 capsule (20 mg total) by mouth daily.  Marland Kitchen pyridoxine (B-6) 100 MG tablet Take 100 mg by mouth daily.  . raloxifene (EVISTA) 60 MG tablet Take 1 tablet (60 mg total) by mouth daily  . SUMAtriptan (IMITREX) 50 MG tablet Take 1/2 tablet my mouth as  directed as needed.  . triamcinolone cream (KENALOG) 0.1 % Apply 1 application topically 2 (two) times daily.   No facility-administered encounter medications on file as of 07/24/2019.    Activities of Daily Living In your present state of health, do you have any difficulty performing the following activities: 07/24/2019  Hearing? N  Vision? N  Difficulty concentrating or making decisions? N  Walking or climbing stairs? Y  Comment Chronic R knee pain  Dressing or bathing? N  Doing errands, shopping? N  Preparing Food and eating ? N  Using the Toilet? N  In the past six months, have you accidently leaked urine? N  Do you have problems with loss of bowel control? N  Managing your Medications? N  Managing your Finances? N  Housekeeping or managing your Housekeeping? N  Some recent data might be hidden    Patient Care Team: Einar Pheasant, MD as PCP - General (Internal Medicine)    Assessment:   This is a routine wellness examination for [name].  Nurse connected with patient 07/24/19 at 10:30 AM EDT by a telephone enabled telemedicine application and verified that I am speaking with the correct person using two identifiers. Patient stated full name and DOB. Patient gave permission to continue with virtual visit. Patient's location was at home and Nurse's location was at [place] office.   Patient is alert and oriented x3. Patient denies difficulty focusing  or concentrating. Patient likes to read, make cards, completes puzzles and quilts for brain health.   Health Maintenance Due: See completed HM at the end of note.   Eye: Visual acuity not assessed. Virtual visit. Followed by their ophthalmologist. Wears glasses.   Dental: Visits every 6 months.    Hearing: Demonstrates normal hearing during visit.  Safety:  Patient feels safe at home- yes Patient does have smoke detectors at home- yes Patient does wear sunscreen or protective clothing when in direct sunlight - yes Patient does wear seat belt when  in a moving vehicle - yes Patient drives- yes Adequate lighting in walkways free from debris- yes Grab bars and handrails used as appropriate- yes Ambulates with an assistive device- no Cell phone on person when ambulating outside of the home-yes  Social: Alcohol intake - no  Smoking history- never  Smokers in home? none Illicit drug use? none  Medication: Taking as directed and without issues.  Pill box in use -yes  Self managed - yes   Covid-19: Precautions and sickness symptoms discussed. Wears mask, social distancing, hand hygiene as appropriate.   Activities of Daily Living Patient denies needing assistance with: household chores, feeding themselves, getting from bed to chair, getting to the toilet, bathing/showering, dressing, managing money, or preparing meals.   Discussed the importance of a healthy diet, water intake and the benefits of aerobic exercise.  Physical activity- active in the yard, around the home. Plans to start swimming and exercise at the gym again.   Diet:  Low cholesterol  Water: good intake Caffeine: 1 can of [place] daily  Other Providers Patient Care Team: Einar Pheasant, MD as PCP - General (Internal Medicine)  Exercise Activities and Dietary recommendations Current Exercise Habits: Home exercise routine, Intensity: Mild  Goals      Patient Stated   . Increase physical activity  (pt-stated)     Increase walking, yoga, swim, kayak for exercise       Fall Risk Fall Risk  07/24/2019 07/23/2018 12/31/2016 12/23/2015 10/10/2015  Falls in the past year? 0 0 No No No  Follow up Falls evaluation completed - - - -   Timed Get Up and Go performed:   Depression Screen PHQ 2/9 Scores 07/24/2019 07/23/2018 12/31/2016 12/31/2016  PHQ - 2 Score 0 0 0 0  PHQ- 9 Score - - 1 -     Cognitive Function     6CIT Screen 07/24/2019 07/23/2018  What Year? 0 points 0 points  What month? 0 points 0 points  What time? 0 points 0 points  Count back from 20 - 0 points  Months in reverse 0 points 0 points  Repeat phrase - 0 points  Total Score - 0    Immunization History  Administered Date(s) Administered  . Fluad Quad(high Dose 65+) 12/25/2018  . Hepatitis B 04/03/1991  . Influenza Split 01/17/2012  . Influenza, High Dose Seasonal PF 04/23/2016, 02/04/2018  . Influenza,inj,Quad PF,6+ Mos 12/21/2013, 01/25/2015  . Influenza-Unspecified 03/04/2017  . Moderna SARS-COVID-2 Vaccination 06/03/2019, 07/02/2019  . Pneumococcal Conjugate-13 07/27/2014  . Pneumococcal Polysaccharide-23 03/11/2018  . Tdap 08/31/2013  . Zoster 05/27/2015   Screening Tests Health Maintenance  Topic Date Due  . MAMMOGRAM  01/19/2020  . TETANUS/TDAP  09/01/2023  . COLONOSCOPY  10/19/2024  . DEXA SCAN  Completed  . COVID-19 Vaccine  Completed  . Hepatitis C Screening  Completed  . PNA vac Low Risk Adult  Completed  . INFLUENZA VACCINE  Discontinued      Plan:   Keep all routine maintenance appointments.   Follow up 12/29/19   Medicare Attestation I have personally reviewed: The patient's medical and social history Their use of alcohol, tobacco or illicit drugs Their current medications and supplements The patient's functional ability including ADLs,fall risks, home safety risks, cognitive, and hearing and visual impairment Diet and physical activities Evidence for depression   I have reviewed  and discussed with patient certain preventive protocols, quality metrics, and best practice recommendations.      Varney Biles, LPN  [id number]  Reviewed above information.  Agree with assessment and plan.   Dr Nicki Reaper

## 2019-12-29 ENCOUNTER — Ambulatory Visit (INDEPENDENT_AMBULATORY_CARE_PROVIDER_SITE_OTHER): Payer: Medicare HMO | Admitting: Internal Medicine

## 2019-12-29 ENCOUNTER — Other Ambulatory Visit: Payer: Self-pay

## 2019-12-29 ENCOUNTER — Encounter: Payer: Self-pay | Admitting: Internal Medicine

## 2019-12-29 ENCOUNTER — Ambulatory Visit (INDEPENDENT_AMBULATORY_CARE_PROVIDER_SITE_OTHER): Payer: Medicare HMO

## 2019-12-29 VITALS — BP 126/70 | HR 76 | Temp 98.2°F | Resp 16 | Ht 62.0 in | Wt 118.4 lb

## 2019-12-29 DIAGNOSIS — Z0000 Encounter for general adult medical examination without abnormal findings: Secondary | ICD-10-CM

## 2019-12-29 DIAGNOSIS — R2230 Localized swelling, mass and lump, unspecified upper limb: Secondary | ICD-10-CM | POA: Insufficient documentation

## 2019-12-29 DIAGNOSIS — R140 Abdominal distension (gaseous): Secondary | ICD-10-CM

## 2019-12-29 DIAGNOSIS — Z0001 Encounter for general adult medical examination with abnormal findings: Secondary | ICD-10-CM | POA: Diagnosis not present

## 2019-12-29 DIAGNOSIS — R2232 Localized swelling, mass and lump, left upper limb: Secondary | ICD-10-CM

## 2019-12-29 DIAGNOSIS — F439 Reaction to severe stress, unspecified: Secondary | ICD-10-CM | POA: Diagnosis not present

## 2019-12-29 DIAGNOSIS — Z1231 Encounter for screening mammogram for malignant neoplasm of breast: Secondary | ICD-10-CM

## 2019-12-29 LAB — BASIC METABOLIC PANEL
BUN: 15 mg/dL (ref 6–23)
CO2: 32 mEq/L (ref 19–32)
Calcium: 9.2 mg/dL (ref 8.4–10.5)
Chloride: 104 mEq/L (ref 96–112)
Creatinine, Ser: 1 mg/dL (ref 0.40–1.20)
GFR: 54.57 mL/min — ABNORMAL LOW (ref >60.00)
Glucose, Bld: 93 mg/dL (ref 70–99)
Potassium: 4.3 mEq/L (ref 3.5–5.1)
Sodium: 140 mEq/L (ref 135–145)

## 2019-12-29 LAB — CBC WITH DIFFERENTIAL/PLATELET
Basophils Absolute: 0 10*3/uL (ref 0.0–0.1)
Basophils Relative: 0.7 % (ref 0.0–3.0)
Eosinophils Absolute: 0.2 10*3/uL (ref 0.0–0.7)
Eosinophils Relative: 3.8 % (ref 0.0–5.0)
HCT: 39.6 % (ref 36.0–46.0)
Hemoglobin: 13.2 g/dL (ref 12.0–15.0)
Lymphocytes Relative: 20.3 % (ref 12.0–46.0)
Lymphs Abs: 1.3 10*3/uL (ref 0.7–4.0)
MCHC: 33.3 g/dL (ref 30.0–36.0)
MCV: 92.1 fl (ref 78.0–100.0)
Monocytes Absolute: 0.3 10*3/uL (ref 0.1–1.0)
Monocytes Relative: 5.5 % (ref 3.0–12.0)
Neutro Abs: 4.3 10*3/uL (ref 1.4–7.7)
Neutrophils Relative %: 69.7 % (ref 43.0–77.0)
Platelets: 252 10*3/uL (ref 150.0–400.0)
RBC: 4.3 Mil/uL (ref 3.87–5.11)
RDW: 12.9 % (ref 11.5–15.5)
WBC: 6.2 10*3/uL (ref 4.0–10.5)

## 2019-12-29 LAB — LIPID PANEL
Cholesterol: 182 mg/dL (ref 0–200)
HDL: 58.9 mg/dL (ref >39.00)
LDL Cholesterol: 95 mg/dL (ref 0–99)
NonHDL: 122.77
Total CHOL/HDL Ratio: 3
Triglycerides: 138 mg/dL (ref 0.0–149.0)
VLDL: 27.6 mg/dL (ref 0.0–40.0)

## 2019-12-29 LAB — HEPATIC FUNCTION PANEL
ALT: 15 U/L (ref 0–35)
AST: 26 U/L (ref 0–37)
Albumin: 4.3 g/dL (ref 3.5–5.2)
Alkaline Phosphatase: 102 U/L (ref 39–117)
Bilirubin, Direct: 0.1 mg/dL (ref 0.0–0.3)
Total Bilirubin: 0.6 mg/dL (ref 0.2–1.2)
Total Protein: 6.7 g/dL (ref 6.0–8.3)

## 2019-12-29 LAB — LIPASE: Lipase: 55 U/L (ref 11.0–59.0)

## 2019-12-29 LAB — AMYLASE: Amylase: 426 U/L — ABNORMAL HIGH (ref 27–131)

## 2019-12-29 NOTE — Progress Notes (Signed)
Patient ID: Yolanda Perkins, female   DOB: 02/22/1951, 69 y.o.   MRN: [id number]   Subjective:    Patient ID: Yolanda Perkins, female    DOB: 02/03/1951, 69 y.o.   MRN: [id number]  HPI This visit occurred during the SARS-CoV-2 public health emergency.  Safety protocols were in place, including screening questions prior to the visit, additional usage of staff PPE, and extensive cleaning of exam room while observing appropriate contact time as indicated for disinfecting solutions.  Patient here for her physical exam.  She reports she is doing relatively well.  Handling stress.  Does report that she gets full fast.  Also reports some bloating.  States she changed the timing of her prilosec and is now taking in the evening.  Feels this has helped her pm symptoms.  No chest pain or sob reported.  Bowels are moving.  Handling stress.     Past Medical History:  Diagnosis Date   Chicken pox    Depression    Migraines    Urinary tract bacterial infections    Past Surgical History:  Procedure Laterality Date   HERNIA REPAIR  1962   TONSILLECTOMY AND ADENOIDECTOMY  1954   Family History  Problem Relation Age of Onset   Arthritis Mother        rheumatoid -which is in remission   Osteoporosis Mother    Transient ischemic attack Mother    Congestive Heart Failure Mother    Diverticulitis Mother    Ovarian cancer Mother    Macular degeneration Mother    Heart disease Father        s/p bypass surgery   Macular degeneration Father    Breast cancer Maternal Aunt    Uterine cancer Maternal Aunt    Uterine cancer Maternal Grandmother    Breast cancer Maternal Aunt    Breast cancer Cousin 62   Social History   Socioeconomic History   Marital status: Married    Spouse name: Not on file   Number of children: Not on file   Years of education: Not on file   Highest education level: Not on file  Occupational History   Not on file  Tobacco Use   Smoking status: Never  Smoker   Smokeless tobacco: Never Used  Substance and Sexual Activity   Alcohol use: No    Alcohol/week: 0.0 standard drinks   Drug use: No   Sexual activity: Not on file  Other Topics Concern   Not on file  Social History Narrative   She is married and has 2 daughters.   Social Determinants of Health   Financial Resource Strain:    Difficulty of Paying Living Expenses: Not on file  Food Insecurity:    Worried About Charity fundraiser in the Last Year: Not on file   [employer] of Food in the Last Year: Not on file  Transportation Needs:    Lack of Transportation (Medical): Not on file   Lack of Transportation (Non-Medical): Not on file  Physical Activity:    Days of Exercise per Week: Not on file   Minutes of Exercise per Session: Not on file  Stress:    Feeling of Stress : Not on file  Social Connections:    Frequency of Communication with Friends and Family: Not on file   Frequency of Social Gatherings with Friends and Family: Not on file   Attends Religious Services: Not on file   Active Member of Clubs or Organizations: Not  on file   Attends Club or Organization Meetings: Not on file   Marital Status: Not on file    Outpatient Encounter Medications as of 12/29/2019  Medication Sig   azelastine (ASTELIN) 0.1 % nasal spray Place 1 spray into both nostrils 2 (two) times daily. Use in each nostril as directed   Calcium Carbonate-Vit D-Min 600-400 MG-UNIT TABS Take 1 tablet by mouth 2 (two) times daily.   Cholecalciferol (VITAMIN D3) 1000 UNITS CAPS Take 1 capsule by mouth daily.   estradiol (ESTRACE) 0.1 MG/GM vaginal cream Use as directed.   meloxicam (MOBIC) 7.5 MG tablet Take 1 tablet (7.5 mg total) by mouth daily as needed for pain. With meals   metaxalone (SKELAXIN) 800 MG tablet 1/2 - 1 tablet q pm prn.   Misc Natural Products (GLUCOSAMINE-CHONDROITIN SULF) TABS Take 1 tablet by mouth daily.   Multiple Vitamin (MULTIVITAMIN) tablet Take 1  tablet by mouth daily.   omeprazole (PRILOSEC) 20 MG capsule Take 1 capsule (20 mg total) by mouth daily.   pyridoxine (B-6) 100 MG tablet Take 100 mg by mouth daily.   raloxifene (EVISTA) 60 MG tablet Take 1 tablet (60 mg total) by mouth daily   SUMAtriptan (IMITREX) 50 MG tablet Take 1/2 tablet my mouth as  directed as needed.   triamcinolone cream (KENALOG) 0.1 % Apply 1 application topically 2 (two) times daily.   No facility-administered encounter medications on file as of 12/29/2019.    Review of Systems  Constitutional: Negative for unexpected weight change.       Reports - early satiety.    HENT: Negative for congestion, sinus pressure and sore throat.   Eyes: Negative for pain and visual disturbance.  Respiratory: Negative for cough, chest tightness and shortness of breath.   Cardiovascular: Negative for chest pain, palpitations and leg swelling.  Gastrointestinal: Negative for diarrhea, nausea and vomiting.       Abdominal bloating as outlined.    Genitourinary: Negative for difficulty urinating and dysuria.  Musculoskeletal: Negative for back pain and joint swelling.  Skin: Negative for color change and rash.  Neurological: Negative for dizziness, light-headedness and headaches.  Hematological: Negative for adenopathy. Does not bruise/bleed easily.  Psychiatric/Behavioral: Negative for agitation and dysphoric mood.       Objective:    Physical Exam Vitals reviewed.  Constitutional:      General: She is not in acute distress.    Appearance: Normal appearance.  HENT:     Head: Normocephalic and atraumatic.     Right Ear: External ear normal.     Left Ear: External ear normal.  Eyes:     General: No scleral icterus.       Right eye: No discharge.        Left eye: No discharge.     Conjunctiva/sclera: Conjunctivae normal.  Neck:     Thyroid: No thyromegaly.  Cardiovascular:     Rate and Rhythm: Normal rate and regular rhythm.     Pulses: Normal pulses.    Pulmonary:     Effort: No respiratory distress.     Breath sounds: Normal breath sounds. No wheezing.     Comments: Breasts:  No nipple discharge or nipple retraction present. Palpable nodule/fullness - left axilla. No other breast nodules palpated.   Abdominal:     General: Bowel sounds are normal.     Palpations: Abdomen is soft.     Comments: Palpable fullness/mass - right abdomen.    Musculoskeletal:  General: No swelling or tenderness.     Cervical back: Neck supple. No tenderness.  Lymphadenopathy:     Cervical: No cervical adenopathy.  Skin:    Findings: No erythema or rash.  Neurological:     Mental Status: She is alert.  Psychiatric:        Mood and Affect: Mood normal.        Behavior: Behavior normal.     BP 126/70    Pulse 76    Temp 98.2 F (36.8 C) (Oral)    Resp 16    Ht 5\' 2"  (1.575 m)    Wt 118 lb 6.4 oz (53.7 kg)    LMP 04/19/1999    SpO2 98%    BMI 21.66 kg/m  Wt Readings from Last 3 Encounters:  12/29/19 118 lb 6.4 oz (53.7 kg)  07/24/19 117 lb (53.1 kg)  06/25/19 117 lb 3.2 oz (53.2 kg)     Lab Results  Component Value Date   WBC 6.2 12/29/2019   HGB 13.2 12/29/2019   HCT 39.6 12/29/2019   PLT 252.0 12/29/2019   GLUCOSE 93 12/29/2019   CHOL 182 12/29/2019   TRIG 138.0 12/29/2019   HDL 58.90 12/29/2019   LDLCALC 95 12/29/2019   ALT 15 12/29/2019   AST 26 12/29/2019   NA 140 12/29/2019   K 4.3 12/29/2019   CL 104 12/29/2019   CREATININE 1.00 12/29/2019   BUN 15 12/29/2019   CO2 32 12/29/2019   TSH 1.53 07/09/2019    DG Bone Density  Result Date: 01/19/2019 EXAM: DUAL X-RAY ABSORPTIOMETRY (DXA) FOR BONE MINERAL DENSITY IMPRESSION: Technologist: [facility] Your patient Yolanda Perkins completed a BMD test on 01/19/2019 using the [place] (analysis version: 14.10) manufactured by [employer]. The following summarizes the results of our evaluation. PATIENT BIOGRAPHICAL: Name: Yolanda Perkins, Yolanda Perkins Patient ID: [id number] Birth Date: Aug 28, 1950  Height: 62.0 in. Gender: Female Exam Date: 01/19/2019 Weight: 121.2 lbs. Indications: Advanced Age, Caucasian, left knee replacement, Postmenopausal Fractures: Treatments: Calcium, Evista, Vitamin D ASSESSMENT: The BMD measured at Femur Neck Left is 0.732 g/cm2 with a T-score of -2.2. This patient is considered osteopenic according to [facility]) criteria. Lumbar spine was not utilized due to advanced degenerative changes. Patient is not a candidate for FRAX due to Evista. The scan quality is good. Site Region Measured Measured WHO Young Adult BMD Date       Age      Classification T-score DualFemur Neck Left 01/19/2019 70.5 Osteopenia -2.2 0.732 g/cm2 Left Forearm Radius 33% 01/19/2019 70.5 Osteopenia -2.1 0.688 g/cm2 World Health Organization [facility]) criteria for post-menopausal, Caucasian Women: Normal:       T-score at or above -1 SD Osteopenia:   T-score between -1 and -2.5 SD Osteoporosis: T-score at or below -2.5 SD RECOMMENDATIONS: 1. All patients should optimize calcium and vitamin D intake. 2. Consider FDA-approved medical therapies in postmenopausal women and men aged 72 years and older, based on the following: a. A hip or vertebral(clinical or morphometric) fracture b. T-score < -2.5 at the femoral neck or spine after appropriate evaluation to exclude secondary causes c. Low bone mass (T-score between -1.0 and -2.5 at the femoral neck or spine) and a 10-year probability of a hip fracture > 3% or a 10-year probability of a major osteoporosis-related fracture > 20% based on the [place]-adapted WHO algorithm d. Clinician judgment and/or patient preferences may indicate treatment for people with 10-year fracture probabilities above or below these levels FOLLOW-UP:  People with diagnosed cases of osteoporosis or at high risk for fracture should have regular bone mineral density tests. For patients eligible for Medicare, routine testing is allowed once every 2 years. The testing frequency can be  increased to one year for patients who have rapidly progressing disease, those who are receiving or discontinuing medical therapy to restore bone mass, or have additional risk factors. I have reviewed this report, and agree with the above findings. [facility] Electronically Signed   By: Dorise Bullion III M.D   On: 01/19/2019 11:21   MM 3D SCREEN BREAST BILATERAL  Result Date: 01/19/2019 CLINICAL DATA:  Screening. EXAM: DIGITAL SCREENING BILATERAL MAMMOGRAM WITH TOMO AND CAD COMPARISON:  Previous exam(s). ACR Breast Density Category b: There are scattered areas of fibroglandular density. FINDINGS: There are no findings suspicious for malignancy. Images were processed with CAD. IMPRESSION: No mammographic evidence of malignancy. A result letter of this screening mammogram will be mailed directly to the patient. RECOMMENDATION: Screening mammogram in one year. (Code:SM-B-01Y) BI-RADS CATEGORY  1: Negative. Electronically Signed   By: Dorise Bullion III M.D   On: 01/19/2019 17:44       Assessment & Plan:   Problem List Items Addressed This Visit    Stress    Appears to be doing relatively well.  Follow.  On no medication now.       Health care maintenance    Physical today 12/29/19.  Mammogram 01/19/19 - Birads I.  Palpable area left axilla.  Will schedule diagnostic mammogram with ultrasound.  Colonoscopy 10/2014.  Recommended f/u in 10 years.        Axillary lump    Palpated on exam.  Schedule diagnostic mammogram with possible ultrasound.       Relevant Orders   MM DIAG BREAST TOMO BILATERAL (Completed)   US BREAST LTD UNI LEFT INC AXILLA (Completed)   Abdominal bloating    Abdominal bloating and palpable fullness as outlined.  Check KUB.  Discussed possible CT scan given exam findings with associated early satiety, etc.  Check cbc, liver panel and metabolic panel along with pancreatic enzymes. Also with associated increased acid reflux.  Changing timing of when she takes her  PPI has helped.  Plan w/up as outlined.        Relevant Orders   DG Abd 1 View (Completed)   CBC with Differential/Platelet (Completed)   Hepatic function panel (Completed)   Lipid panel (Completed)   Basic metabolic panel (Completed)   Amylase (Completed)   Lipase (Completed)    Other Visit Diagnoses    Encounter for screening mammogram for malignant neoplasm of breast    -  Primary       Einar Pheasant, MD

## 2019-12-29 NOTE — Assessment & Plan Note (Addendum)
Physical today 12/29/19.  Mammogram 01/19/19 - Birads I.  Palpable area left axilla.  Will schedule diagnostic mammogram with ultrasound.  Colonoscopy 10/2014.  Recommended f/u in 10 years.

## 2020-01-01 ENCOUNTER — Other Ambulatory Visit: Payer: Self-pay

## 2020-01-01 ENCOUNTER — Ambulatory Visit
Admission: RE | Admit: 2020-01-01 | Discharge: 2020-01-01 | Disposition: A | Discharge status: Home / Self Care | Payer: Medicare HMO | Source: Ambulatory Visit | Attending: Internal Medicine | Admitting: Internal Medicine

## 2020-01-01 DIAGNOSIS — R2232 Localized swelling, mass and lump, left upper limb: Secondary | ICD-10-CM | POA: Diagnosis not present

## 2020-01-01 DIAGNOSIS — N6459 Other signs and symptoms in breast: Secondary | ICD-10-CM | POA: Insufficient documentation

## 2020-01-08 ENCOUNTER — Other Ambulatory Visit: Payer: Self-pay | Admitting: Internal Medicine

## 2020-01-08 DIAGNOSIS — R1900 Intra-abdominal and pelvic swelling, mass and lump, unspecified site: Secondary | ICD-10-CM

## 2020-01-08 DIAGNOSIS — R140 Abdominal distension (gaseous): Secondary | ICD-10-CM

## 2020-01-08 NOTE — Assessment & Plan Note (Addendum)
Abdominal bloating and palpable fullness as outlined.  Check KUB.  Discussed possible CT scan given exam findings with associated early satiety, etc.  Check cbc, liver panel and metabolic panel along with pancreatic enzymes. Also with associated increased acid reflux.  Changing timing of when she takes her PPI has helped.  Plan w/up as outlined.

## 2020-01-08 NOTE — Assessment & Plan Note (Signed)
Appears to be doing relatively well.  Follow.  On no medication now.

## 2020-01-08 NOTE — Progress Notes (Signed)
Order placed for CT scan.  

## 2020-01-08 NOTE — Assessment & Plan Note (Signed)
Palpated on exam.  Schedule diagnostic mammogram with possible ultrasound.

## 2020-01-09 ENCOUNTER — Other Ambulatory Visit: Payer: Self-pay | Admitting: Internal Medicine

## 2020-01-09 DIAGNOSIS — R2232 Localized swelling, mass and lump, left upper limb: Secondary | ICD-10-CM

## 2020-01-09 NOTE — Progress Notes (Signed)
Order placed for surgery referral.  

## 2020-01-12 ENCOUNTER — Telehealth: Payer: Self-pay

## 2020-01-12 NOTE — Telephone Encounter (Signed)
Pt returned your call.  

## 2020-01-13 ENCOUNTER — Telehealth: Payer: Self-pay | Admitting: Internal Medicine

## 2020-01-13 NOTE — Telephone Encounter (Signed)
lft vm for pt to call ofc to sch CT. 

## 2020-01-13 NOTE — Telephone Encounter (Signed)
Yolanda Perkins called to schedule CT

## 2020-01-14 ENCOUNTER — Other Ambulatory Visit: Payer: Self-pay | Admitting: Internal Medicine

## 2020-01-22 ENCOUNTER — Other Ambulatory Visit: Payer: Self-pay

## 2020-01-22 ENCOUNTER — Ambulatory Visit
Admission: RE | Admit: 2020-01-22 | Discharge: 2020-01-22 | Disposition: A | Discharge status: Home / Self Care | Payer: Medicare HMO | Source: Ambulatory Visit | Attending: Internal Medicine | Admitting: Internal Medicine

## 2020-01-22 DIAGNOSIS — R140 Abdominal distension (gaseous): Secondary | ICD-10-CM | POA: Diagnosis not present

## 2020-01-22 DIAGNOSIS — R1900 Intra-abdominal and pelvic swelling, mass and lump, unspecified site: Secondary | ICD-10-CM | POA: Diagnosis present

## 2020-01-22 MED ORDER — IOHEXOL 300 MG/ML  SOLN
100.0000 mL | Freq: Once | INTRAMUSCULAR | Status: AC | PRN
  Administered 2020-01-22: 85 mL via INTRAVENOUS

## 2020-01-23 ENCOUNTER — Encounter: Payer: Self-pay | Admitting: Internal Medicine

## 2020-01-23 DIAGNOSIS — R935 Abnormal findings on diagnostic imaging of other abdominal regions, including retroperitoneum: Secondary | ICD-10-CM

## 2020-01-23 DIAGNOSIS — R1900 Intra-abdominal and pelvic swelling, mass and lump, unspecified site: Secondary | ICD-10-CM

## 2020-01-25 ENCOUNTER — Telehealth: Payer: Self-pay

## 2020-01-25 NOTE — Telephone Encounter (Signed)
I am sorry I missed your call.  Please call back at [phone].  Thank you

## 2020-01-25 NOTE — Telephone Encounter (Signed)
Pt said Dr. Nicki Reaper tried to call her over the weekend. She needs a call back before 11am because her husband has to go to [place]. She said you can try her cell phone after that but she is unsure about getting calls on her cell phone.

## 2020-01-25 NOTE — Telephone Encounter (Signed)
Patient called in stated that she was waiting a call back from [place] and that she can reach her on cell [place] or [phone]

## 2020-01-25 NOTE — Telephone Encounter (Signed)
Discussed CT scan results with pt.  Discussed the need for further evaluation and treatment.  Refer to gyn oncology.  Order placed for Dr Theora Gianotti.

## 2020-01-26 NOTE — Telephone Encounter (Signed)
Good morning!  Received and sent

## 2020-01-26 NOTE — Telephone Encounter (Signed)
Yw! 

## 2020-01-26 NOTE — Telephone Encounter (Signed)
Pt notified of CT results.  Notified of need for referral to gyn oncology given findings.  Pt agreeable and wants to be seen at [facility].  Order placed for referral.  Order placed as urgent.  Wanted you to be aware.  Thanks.

## 2020-01-26 NOTE — Telephone Encounter (Signed)
Thank you :)

## 2020-01-31 IMAGING — MG MM DIGITAL SCREENING BILAT W/ TOMO W/ CAD
8 series · 9 of 24 positions shown · non-contrast
Comparison: Previous exam(s).

CLINICAL DATA: Screening.

EXAM:
DIGITAL SCREENING BILATERAL MAMMOGRAM WITH TOMO AND CAD

[L CC synth-2D]
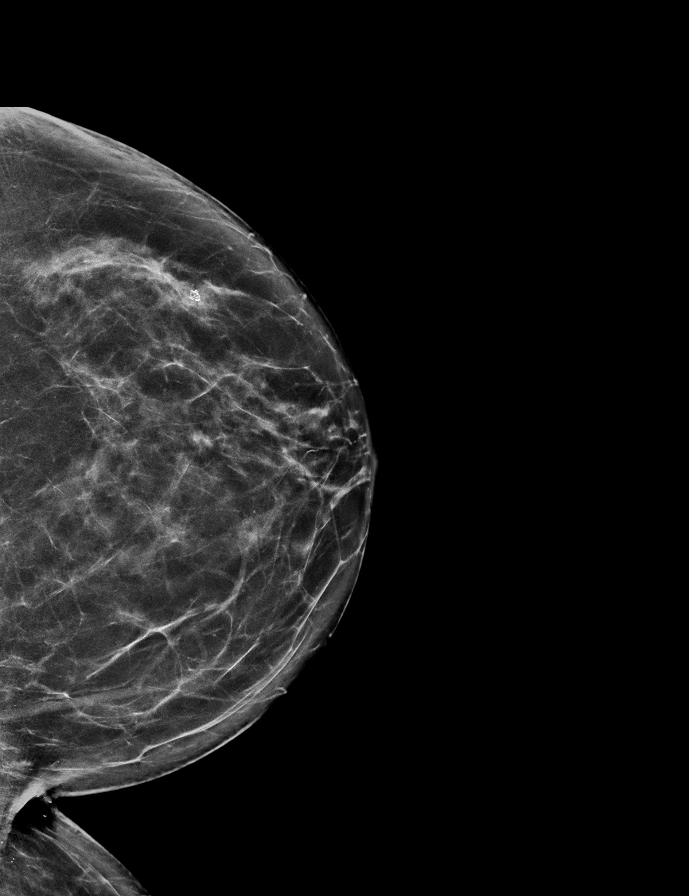

[R CC synth-2D]
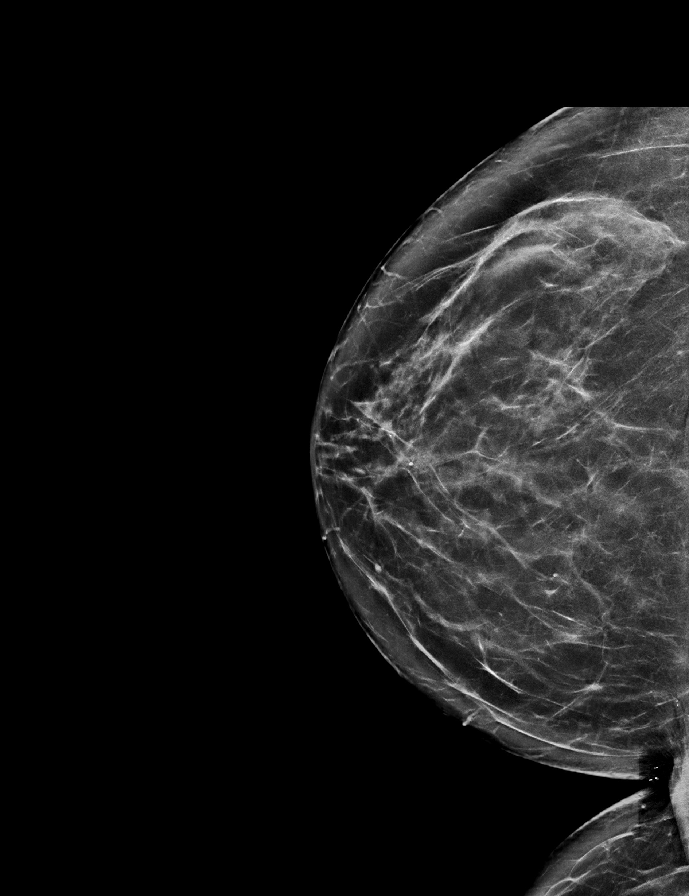

[L MLO synth-2D]
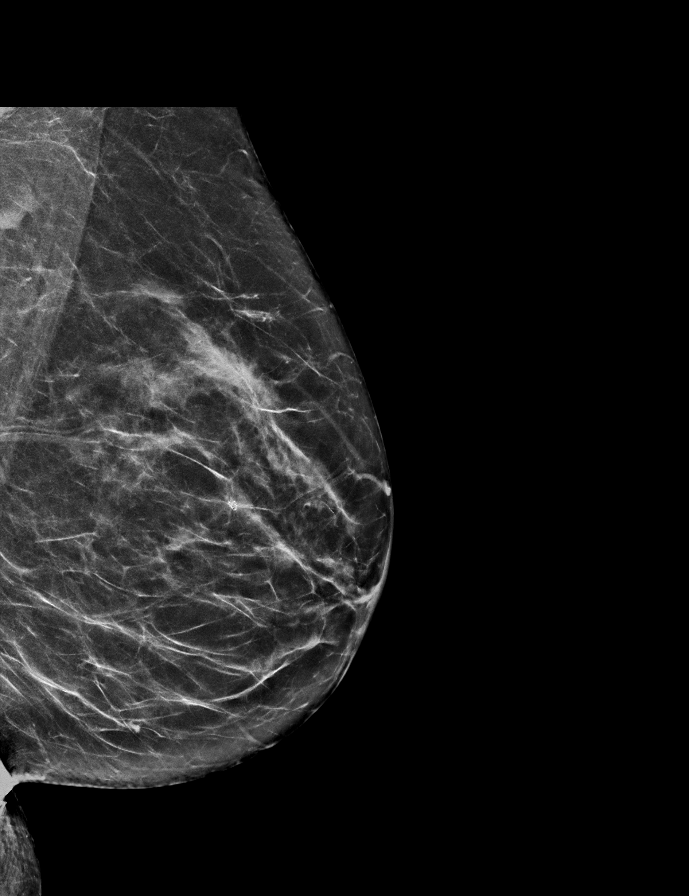

[R MLO synth-2D]
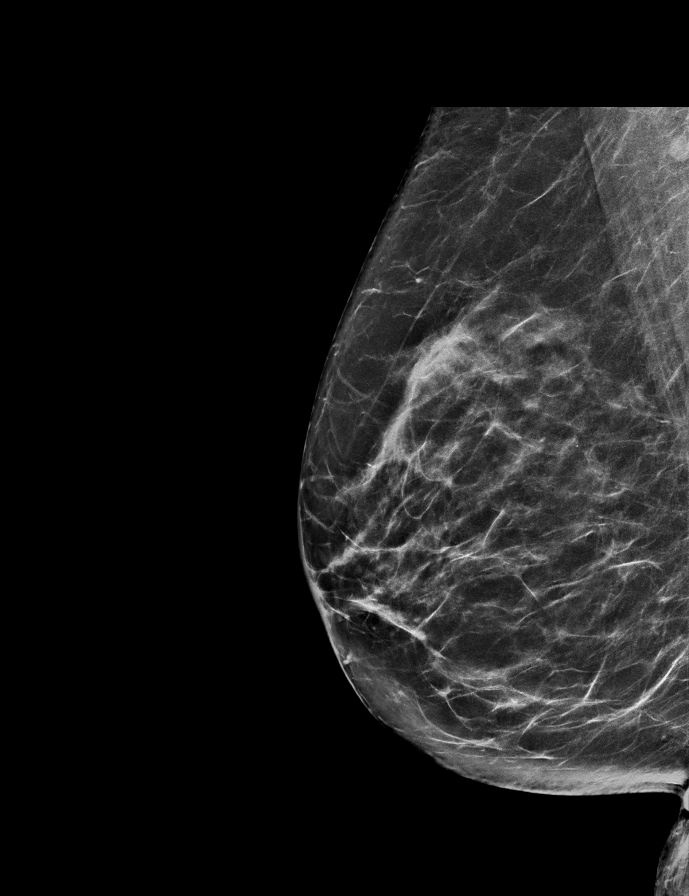

[R MLO tomo · 2 of 67 frames shown]
[frame 22/67]
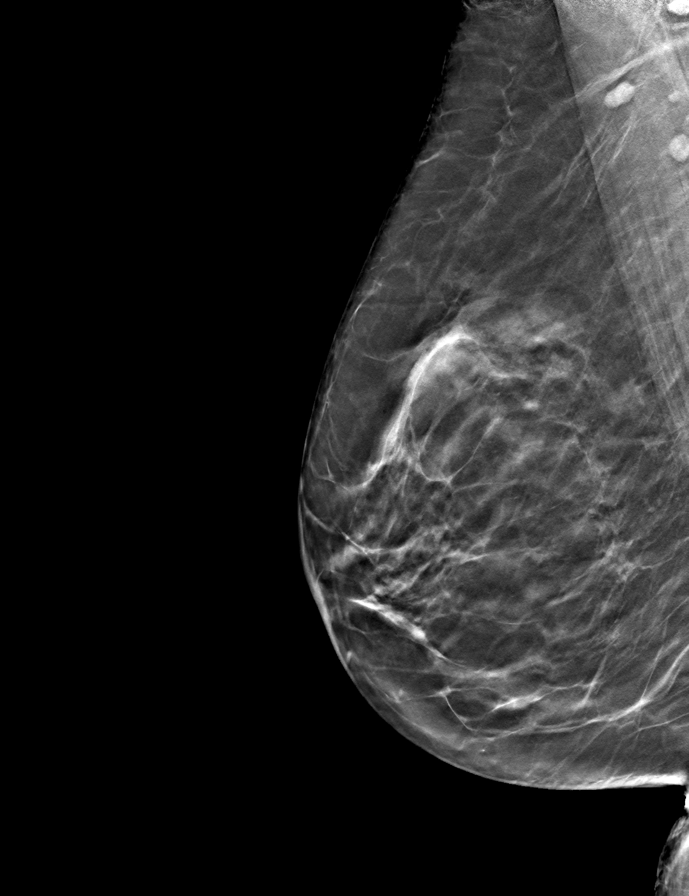
[frame 34/67]
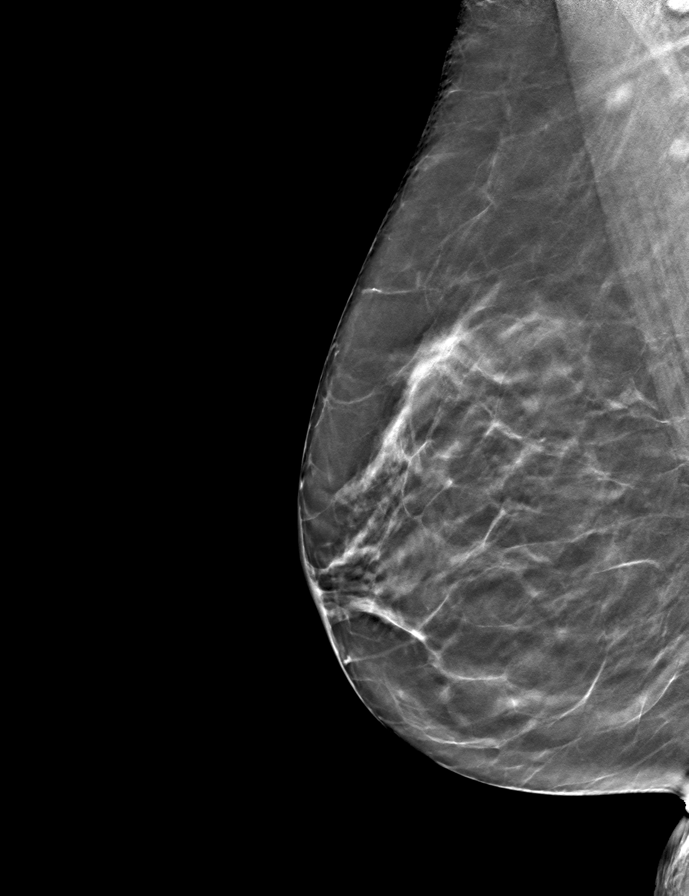

[R CC tomo · tomo slice 37/73.0]
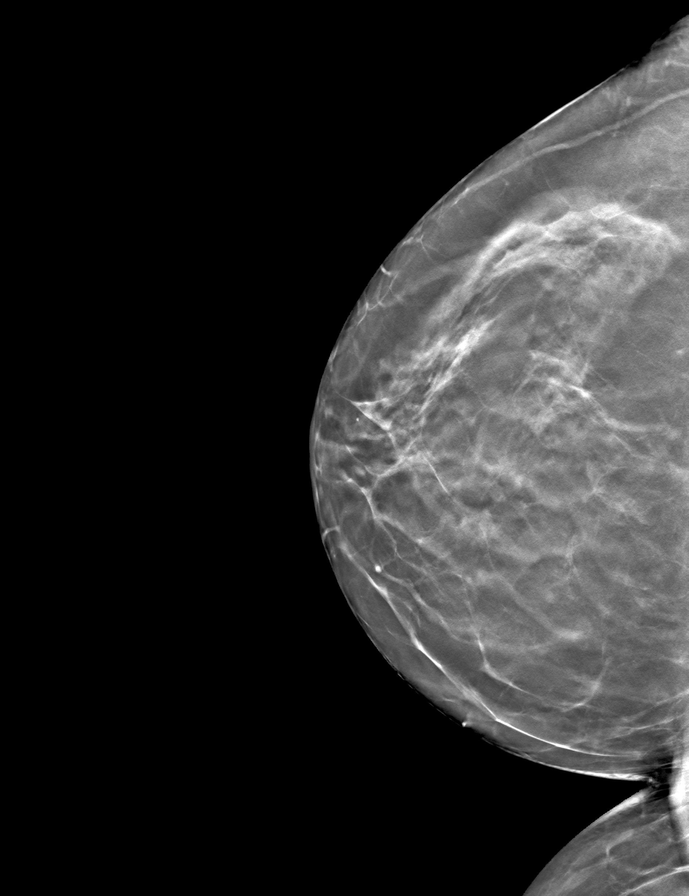

[L MLO tomo · tomo slice 33/65.0]
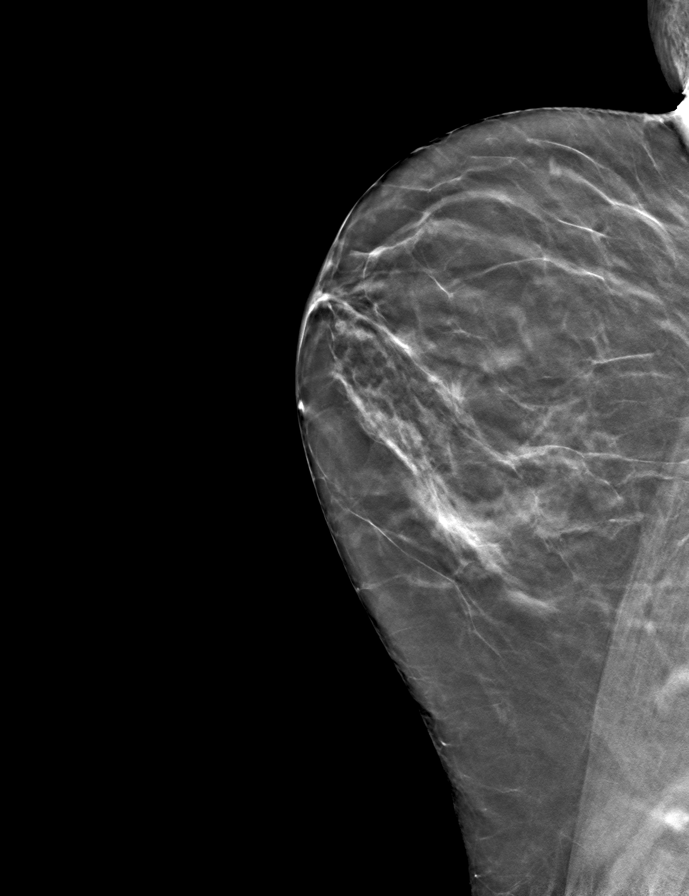

[L CC tomo · tomo slice 33/66.0]
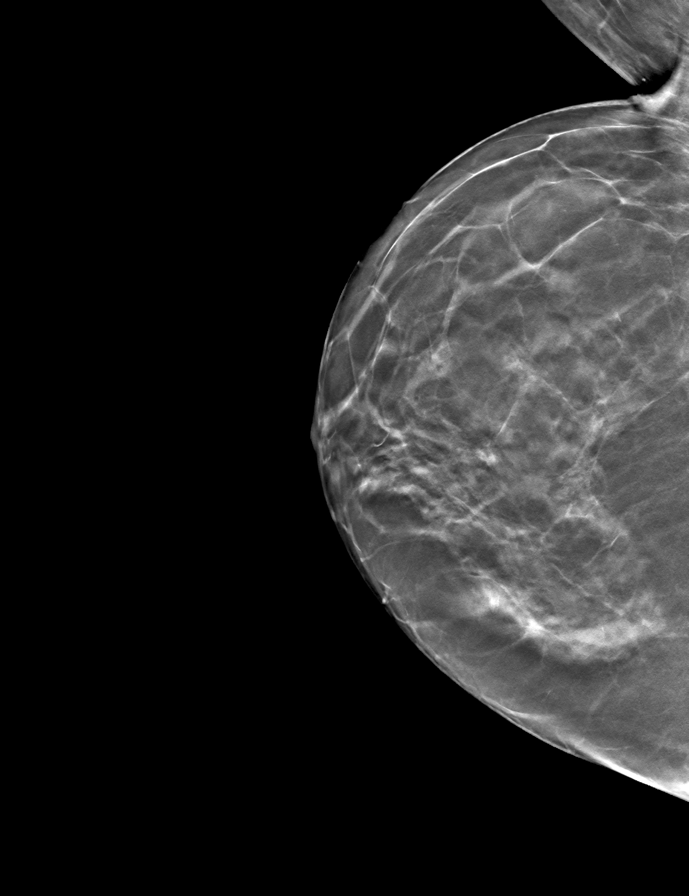

[9 of 24 positions shown; findings below may reference images not displayed]

ACR Breast Density Category b: There are scattered areas of
fibroglandular density.
FINDINGS: There are no findings suspicious for malignancy. Images were
processed with CAD.
IMPRESSION: No mammographic evidence of malignancy. A result letter of this
screening mammogram will be mailed directly to the patient.

RECOMMENDATION:
Screening mammogram in one year. (Code:[id number])

BI-RADS CATEGORY  1: Negative.

## 2020-02-03 ENCOUNTER — Other Ambulatory Visit: Payer: Self-pay | Admitting: Internal Medicine

## 2020-03-10 ENCOUNTER — Encounter: Payer: Self-pay | Admitting: Internal Medicine

## 2020-03-10 ENCOUNTER — Ambulatory Visit: Payer: Medicare HMO | Admitting: Internal Medicine

## 2020-03-10 ENCOUNTER — Other Ambulatory Visit: Payer: Self-pay

## 2020-03-10 DIAGNOSIS — Z9109 Other allergy status, other than to drugs and biological substances: Secondary | ICD-10-CM | POA: Diagnosis not present

## 2020-03-10 DIAGNOSIS — C569 Malignant neoplasm of unspecified ovary: Secondary | ICD-10-CM | POA: Diagnosis not present

## 2020-03-10 DIAGNOSIS — F439 Reaction to severe stress, unspecified: Secondary | ICD-10-CM | POA: Diagnosis not present

## 2020-03-10 MED ORDER — MELOXICAM 7.5 MG PO TABS: 7.5000 mg | ORAL_TABLET | Freq: Every day | ORAL | 0 refills | Status: DC | PRN

## 2020-03-10 NOTE — Progress Notes (Signed)
Patient ID: Yolanda Perkins, female   DOB: 11-22-1950, 69 y.o.   MRN: [id number]   Subjective:    Patient ID: Yolanda Perkins, female    DOB: 24-Jun-1950, 69 y.o.   MRN: [id number]  HPI This visit occurred during the SARS-CoV-2 public health emergency.  Safety protocols were in place, including screening questions prior to the visit, additional usage of staff PPE, and extensive cleaning of exam room while observing appropriate contact time as indicated for disinfecting solutions.  Patient here for a scheduled follow up. Recently diagnosed with ovarian carcinoma.  Receiving chemotherapy.  Reports some fatigue and constipation, but overall appears to be tolerating well.  Handling stress.  Has good support.  No chest pain reported.  Breathing stable.  Eating some better. Decreased indigesion.  Taking wellbutrin.     Past Medical History:  Diagnosis Date  . Chicken pox   . Depression   . Migraines   . Urinary tract bacterial infections    Past Surgical History:  Procedure Laterality Date  . [place]  . TONSILLECTOMY AND ADENOIDECTOMY  1954   Family History  Problem Relation Age of Onset  . Arthritis Mother        rheumatoid -which is in remission  . Osteoporosis Mother   . Transient ischemic attack Mother   . Congestive Heart Failure Mother   . Diverticulitis Mother   . Ovarian cancer Mother   . Macular degeneration Mother   . Heart disease Father        s/p bypass surgery  . Macular degeneration Father   . Breast cancer Maternal Aunt   . Uterine cancer Maternal Aunt   . Uterine cancer Maternal Grandmother   . Breast cancer Maternal Aunt   . Breast cancer Cousin 15   Social History   Socioeconomic History  . Marital status: Married    Spouse name: Not on file  . Number of children: Not on file  . Years of education: Not on file  . Highest education level: Not on file  Occupational History  . Not on file  Tobacco Use  . Smoking status: Never Smoker  . Smokeless  tobacco: Never Used  Substance and Sexual Activity  . Alcohol use: No    Alcohol/week: 0.0 standard drinks  . Drug use: No  . Sexual activity: Not on file  Other Topics Concern  . Not on file  Social History Narrative   She is married and has 2 daughters.   Social Determinants of Health   Financial Resource Strain: Not on file  Food Insecurity: Not on file  Transportation Needs: Not on file  Physical Activity: Not on file  Stress: Not on file  Social Connections: Not on file    Outpatient Encounter Medications as of 03/10/2020  Medication Sig  . azelastine (ASTELIN) 0.1 % nasal spray Place 1 spray into both nostrils 2 (two) times daily. Use in each nostril as directed  . buPROPion (WELLBUTRIN SR) 150 MG 12 hr tablet Take 1 tablet (150 mg total) by mouth daily. (Patient taking differently: 75 mg.)  . calcium carbonate (OSCAL) 1500 (600 Ca) MG TABS tablet Take by mouth.  . Calcium Carbonate-Vit D-Min 600-400 MG-UNIT TABS Take 1 tablet by mouth 2 (two) times daily.  . Calcium Carbonate-Vitamin D 600-400 MG-UNIT tablet Take by mouth.  . Cholecalciferol (VITAMIN D3) 1000 UNITS CAPS Take 1 capsule by mouth daily.  Marland Kitchen dexamethasone (DECADRON) 4 MG tablet Take 2 tablets (8 mg) by mouth daily  with food for 3 days starting the day after chemotherapy each cycle to prevent nausea.  Marland Kitchen estradiol (ESTRACE) 0.1 MG/GM vaginal cream Use as directed.  . fluticasone (FLONASE) 50 MCG/ACT nasal spray Place into the nose.  . loratadine (CLARITIN) 10 MG tablet Take by mouth.  . metaxalone (SKELAXIN) 800 MG tablet 1/2 - 1 tablet q pm prn.  . metroNIDAZOLE (METROCREAM) 0.75 % cream metronidazole 0.75 % topical cream  . Misc Natural Products (GLUCOSAMINE-CHONDROITIN SULF) TABS Take 1 tablet by mouth daily.  . Multiple Vitamin (MULTIVITAMIN) tablet Take 1 tablet by mouth daily.  Marland Kitchen OLANZapine (ZYPREXA) 5 MG tablet Take 1 tablet (5 mg) by mouth nightly for 4 nights, starting on the day of chemotherapy each  cycle to prevent nausea.  Marland Kitchen omeprazole (PRILOSEC) 20 MG capsule Take 1 capsule (20 mg total) by mouth daily.  . ondansetron (ZOFRAN) 8 MG tablet Take by mouth.  . prochlorperazine (COMPAZINE) 5 MG tablet Take 1 tablet (5 mg) by mouth every 6 hours as needed for nausea if nausea not controlled.  . pyridoxine (B-6) 100 MG tablet Take 100 mg by mouth daily.  . raloxifene (EVISTA) 60 MG tablet Take 1 tablet (60 mg total) by mouth daily  . SUMAtriptan (IMITREX) 50 MG tablet Take 1/2 tablet my mouth as  directed as needed.  . triamcinolone cream (KENALOG) 0.1 % Apply 1 application topically 2 (two) times daily.  . [DISCONTINUED] meloxicam (MOBIC) 7.5 MG tablet Take 1 tablet (7.5 mg total) by mouth daily as needed for pain. With meals  . meloxicam (MOBIC) 7.5 MG tablet Take 1 tablet (7.5 mg total) by mouth daily as needed for pain. With meals   No facility-administered encounter medications on file as of 03/10/2020.    Review of Systems  Constitutional: Negative for appetite change and unexpected weight change.  HENT: Negative for congestion and sinus pressure.   Respiratory: Negative for cough, chest tightness and shortness of breath.   Cardiovascular: Negative for chest pain, palpitations and leg swelling.  Gastrointestinal: Negative for abdominal pain, diarrhea and vomiting.  Genitourinary: Negative for difficulty urinating and dysuria.  Skin: Negative for color change and rash.  Neurological: Negative for dizziness, light-headedness and headaches.  Psychiatric/Behavioral: Negative for agitation and dysphoric mood.       Objective:    Physical Exam Vitals reviewed.  Constitutional:      General: She is not in acute distress.    Appearance: Normal appearance.  HENT:     Head: Normocephalic and atraumatic.     Right Ear: External ear normal.     Left Ear: External ear normal.     Mouth/Throat:     Mouth: Oropharynx is clear and moist.  Eyes:     General: No scleral icterus.        Right eye: No discharge.        Left eye: No discharge.     Conjunctiva/sclera: Conjunctivae normal.  Neck:     Thyroid: No thyromegaly.  Cardiovascular:     Rate and Rhythm: Normal rate and regular rhythm.  Pulmonary:     Effort: No respiratory distress.     Breath sounds: Normal breath sounds. No wheezing.  Abdominal:     General: Bowel sounds are normal.     Palpations: Abdomen is soft.     Tenderness: There is no abdominal tenderness.  Musculoskeletal:        General: No swelling, tenderness or edema.     Cervical back: Neck supple. No tenderness.  Lymphadenopathy:     Cervical: No cervical adenopathy.  Skin:    Findings: No erythema or rash.  Neurological:     Mental Status: She is alert.  Psychiatric:        Mood and Affect: Mood normal.        Behavior: Behavior normal.     BP 140/74 (BP Location: Left Arm, Patient Position: Sitting)   Pulse 91   Temp 98.4 F (36.9 C)   Ht 5' 2.01" (1.575 m)   Wt 111 lb 9.6 oz (50.6 kg)   LMP 04/19/1999   SpO2 98%   BMI 20.41 kg/m  Wt Readings from Last 3 Encounters:  03/10/20 111 lb 9.6 oz (50.6 kg)  12/29/19 118 lb 6.4 oz (53.7 kg)  07/24/19 117 lb (53.1 kg)     Lab Results  Component Value Date   WBC 6.2 12/29/2019   HGB 13.2 12/29/2019   HCT 39.6 12/29/2019   PLT 252.0 12/29/2019   GLUCOSE 93 12/29/2019   CHOL 182 12/29/2019   TRIG 138.0 12/29/2019   HDL 58.90 12/29/2019   LDLCALC 95 12/29/2019   ALT 15 12/29/2019   AST 26 12/29/2019   NA 140 12/29/2019   K 4.3 12/29/2019   CL 104 12/29/2019   CREATININE 1.00 12/29/2019   BUN 15 12/29/2019   CO2 32 12/29/2019   TSH 1.53 07/09/2019    CT Abdomen Pelvis W Contrast  Result Date: 01/22/2020 CLINICAL DATA:  Abdominal distension, fullness EXAM: CT ABDOMEN AND PELVIS WITH CONTRAST TECHNIQUE: Multidetector CT imaging of the abdomen and pelvis was performed using the standard protocol following bolus administration of intravenous contrast. CONTRAST:  72mL  OMNIPAQUE IOHEXOL 300 MG/ML  SOLN COMPARISON:  None. FINDINGS: Lower chest: 3 mm nodule partially imaged on the 1st image in the left lung base. No effusions. Heart is normal size. Hepatobiliary: There appear to be serosal masses/implants noted along the liver surface adjacent to the inferior right hepatic lobe with index serosal nodule measuring 2.2 cm on image 29. Gallbladder unremarkable. No intrahepatic abnormality. Pancreas: No focal abnormality or ductal dilatation. Spleen: No focal abnormality.  Normal size. Adrenals/Urinary Tract: 3.2 cm cyst in the upper pole of the left kidney. No hydronephrosis. No adrenal mass. Urinary bladder unremarkable. Stomach/Bowel: Sigmoid diverticulosis. No active diverticulitis. Normal appendix. Stomach and small bowel decompressed, unremarkable. Vascular/Lymphatic: No evidence of aneurysm or adenopathy. Reproductive: Large mass noted within the pelvis which appears to be centered in the right adnexal region. This mass measures 10.6 x 9.2 x 8.5 cm. Mixed solid and cystic components. This extends across the midline and into the cul-de-sac. This is concerning for ovarian cancer. Other: Free fluid adjacent to the liver and spleen. There appears to be omental thickening and nodularity within the right abdomen and right lower quadrant concerning for peritoneal spread of tumor. Musculoskeletal: No acute bony abnormality or focal bone lesion. Degenerative changes in the lumbar spine. IMPRESSION: Large right adnexal pelvic mass measuring up to 10.6 cm. This is complex solid and cystic and is concerning for ovarian cancer. Associated omental thickening, serosal implants along the liver surface, and ascites. Sigmoid diverticulosis. 3 mm left lower lobe pulmonary nodule partially imaged on the 1st image. These results will be called to the ordering clinician or representative by the Radiologist Assistant, and communication documented in the PACS or [employer]. Electronically Signed    By: Rolm Baptise M.D.   On: 01/22/2020 21:57       Assessment & Plan:   Problem  List Items Addressed This Visit    Stress    Overall appears to be handling things well.  Has good support.  Continue wellbutrin - taking 75mg  q day.  Follow.       Environmental allergies    Stable.       Ovarian cancer [facility])    Being followed by oncology.  Started chemo.  Appears to be doing relatively well.  Has good support.  Follow.       Relevant Medications   prochlorperazine (COMPAZINE) 5 MG tablet   ondansetron (ZOFRAN) 8 MG tablet   dexamethasone (DECADRON) 4 MG tablet       Einar Pheasant, MD

## 2020-03-17 ENCOUNTER — Encounter: Payer: Self-pay | Admitting: Internal Medicine

## 2020-03-20 ENCOUNTER — Encounter: Payer: Self-pay | Admitting: Internal Medicine

## 2020-03-20 DIAGNOSIS — C569 Malignant neoplasm of unspecified ovary: Secondary | ICD-10-CM | POA: Insufficient documentation

## 2020-03-20 NOTE — Assessment & Plan Note (Signed)
Stable

## 2020-03-20 NOTE — Assessment & Plan Note (Signed)
Overall appears to be handling things well.  Has good support.  Continue wellbutrin - taking 75mg  q day.  Follow.

## 2020-03-20 NOTE — Assessment & Plan Note (Signed)
Being followed by oncology.  Started chemo.  Appears to be doing relatively well.  Has good support.  Follow.

## 2020-04-13 ENCOUNTER — Other Ambulatory Visit: Payer: Self-pay | Admitting: Internal Medicine

## 2020-05-05 HISTORY — PX: ABDOMINAL HYSTERECTOMY: SHX81

## 2020-05-31 ENCOUNTER — Other Ambulatory Visit: Payer: Self-pay | Admitting: General Surgery

## 2020-05-31 DIAGNOSIS — R2232 Localized swelling, mass and lump, left upper limb: Secondary | ICD-10-CM

## 2020-06-03 ENCOUNTER — Other Ambulatory Visit: Payer: Self-pay | Admitting: Internal Medicine

## 2020-06-10 ENCOUNTER — Ambulatory Visit (INDEPENDENT_AMBULATORY_CARE_PROVIDER_SITE_OTHER): Payer: Medicare HMO | Admitting: Internal Medicine

## 2020-06-10 ENCOUNTER — Encounter: Payer: Self-pay | Admitting: Internal Medicine

## 2020-06-10 ENCOUNTER — Other Ambulatory Visit: Payer: Self-pay

## 2020-06-10 DIAGNOSIS — C569 Malignant neoplasm of unspecified ovary: Secondary | ICD-10-CM

## 2020-06-10 DIAGNOSIS — F439 Reaction to severe stress, unspecified: Secondary | ICD-10-CM | POA: Diagnosis not present

## 2020-06-10 DIAGNOSIS — Z9109 Other allergy status, other than to drugs and biological substances: Secondary | ICD-10-CM | POA: Diagnosis not present

## 2020-06-10 MED ORDER — RALOXIFENE HCL 60 MG PO TABS
60.0000 mg | ORAL_TABLET | Freq: Every day | ORAL | 0 refills | Status: DC
Start: 2020-06-10 — End: 2020-12-21

## 2020-06-10 NOTE — Progress Notes (Signed)
Patient ID: Yolanda Perkins, female   DOB: 10-27-1950, 70 y.o.   MRN: [id number]   Subjective:    Patient ID: Yolanda Perkins, female    DOB: 1950-11-19, 70 y.o.   MRN: [id number]  HPI This visit occurred during the SARS-CoV-2 public health emergency.  Safety protocols were in place, including screening questions prior to the visit, additional usage of staff PPE, and extensive cleaning of exam room while observing appropriate contact time as indicated for disinfecting solutions.  Patient here for a scheduled follow up. 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  Receiving neoadjuvant carbo/paclitaxel.  She is doing relatively well.  Handling stress.  Has good support.  Eating.  No nausea or vomiting.  Some fatigue.  No chest pain.  Breathing stable.  No abdominal pain.  Bowels moving.     Past Medical History:  Diagnosis Date  . Chicken pox   . Depression   . Migraines   . Urinary tract bacterial infections    Past Surgical History:  Procedure Laterality Date  . [place]  . TONSILLECTOMY AND ADENOIDECTOMY  1954   Family History  Problem Relation Age of Onset  . Arthritis Mother        rheumatoid -which is in remission  . Osteoporosis Mother   . Transient ischemic attack Mother   . Congestive Heart Failure Mother   . Diverticulitis Mother   . Ovarian cancer Mother   . Macular degeneration Mother   . Heart disease Father        s/p bypass surgery  . Macular degeneration Father   . Breast cancer Maternal Aunt   . Uterine cancer Maternal Aunt   . Uterine cancer Maternal Grandmother   . Breast cancer Maternal Aunt   . Breast cancer Cousin 28   Social History   Socioeconomic History  . Marital status: Married    Spouse name: Not on file  . Number of children: Not on file  . Years of education: Not on file  . Highest education level: Not on file  Occupational History  . Not on file  Tobacco Use  . Smoking status: Never Smoker  . Smokeless  tobacco: Never Used  Substance and Sexual Activity  . Alcohol use: No    Alcohol/week: 0.0 standard drinks  . Drug use: No  . Sexual activity: Not on file  Other Topics Concern  . Not on file  Social History Narrative   She is married and has 2 daughters.   Social Determinants of Health   Financial Resource Strain: Not on file  Food Insecurity: Not on file  Transportation Needs: Not on file  Physical Activity: Not on file  Stress: Not on file  Social Connections: Not on file    Outpatient Encounter Medications as of 06/10/2020  Medication Sig  . azelastine (ASTELIN) 0.1 % nasal spray Place 1 spray into both nostrils 2 (two) times daily. Use in each nostril as directed  . buPROPion (WELLBUTRIN SR) 150 MG 12 hr tablet Take 1 tablet (150 mg total) by mouth daily. (Patient taking differently: 75 mg.)  . Calcium Carbonate-Vit D-Min 600-400 MG-UNIT TABS Take 1 tablet by mouth 2 (two) times daily.  . Calcium Carbonate-Vitamin D 600-400 MG-UNIT tablet Take by mouth.  . Cholecalciferol (VITAMIN D3) 1000 UNITS CAPS Take 1 capsule by mouth daily.  Marland Kitchen dexamethasone (DECADRON) 4 MG tablet Take 2 tablets (8 mg) by mouth daily with food for 3 days starting the day after chemotherapy  each cycle to prevent nausea.  Marland Kitchen estradiol (ESTRACE) 0.1 MG/GM vaginal cream Use as directed.  . fluticasone (FLONASE) 50 MCG/ACT nasal spray Place into the nose.  . loratadine (CLARITIN) 10 MG tablet Take by mouth.  . meloxicam (MOBIC) 7.5 MG tablet Take 1 tablet (7.5 mg total) by mouth daily as needed for pain. With meals  . metaxalone (SKELAXIN) 800 MG tablet 1/2 - 1 tablet q pm prn.  . metroNIDAZOLE (METROCREAM) 0.75 % cream metronidazole 0.75 % topical cream  . Misc Natural Products (GLUCOSAMINE-CHONDROITIN SULF) TABS Take 1 tablet by mouth daily.  . Multiple Vitamin (MULTIVITAMIN) tablet Take 1 tablet by mouth daily.  Marland Kitchen omeprazole (PRILOSEC) 20 MG capsule Take 1 capsule (20 mg total) by mouth daily.  .  ondansetron (ZOFRAN) 8 MG tablet Take by mouth.  . prochlorperazine (COMPAZINE) 5 MG tablet Take 1 tablet (5 mg) by mouth every 6 hours as needed for nausea if nausea not controlled.  . pyridoxine (B-6) 100 MG tablet Take 100 mg by mouth daily.  . raloxifene (EVISTA) 60 MG tablet Take 1 tablet (60 mg total) by mouth daily.  . SUMAtriptan (IMITREX) 50 MG tablet Take 1/2 tablet my mouth as  directed as needed.  . triamcinolone cream (KENALOG) 0.1 % Apply 1 application topically 2 (two) times daily.  . [DISCONTINUED] calcium carbonate (OSCAL) 1500 (600 Ca) MG TABS tablet Take by mouth.  . [DISCONTINUED] OLANZapine (ZYPREXA) 5 MG tablet Take 1 tablet (5 mg) by mouth nightly for 4 nights, starting on the day of chemotherapy each cycle to prevent nausea.  . [DISCONTINUED] raloxifene (EVISTA) 60 MG tablet Take 1 tablet (60 mg total) by mouth daily   No facility-administered encounter medications on file as of 06/10/2020.    Review of Systems  Constitutional: Negative for appetite change and unexpected weight change.  HENT: Negative for congestion and sinus pressure.   Respiratory: Negative for cough, chest tightness and shortness of breath.   Cardiovascular: Negative for chest pain, palpitations and leg swelling.  Gastrointestinal: Negative for abdominal pain, diarrhea, nausea and vomiting.  Genitourinary: Negative for difficulty urinating and dysuria.  Musculoskeletal: Negative for joint swelling and myalgias.  Skin: Negative for color change and rash.  Neurological: Negative for dizziness, light-headedness and headaches.  Psychiatric/Behavioral: Negative for agitation and dysphoric mood.       Objective:    Physical Exam Vitals reviewed.  Constitutional:      General: She is not in acute distress.    Appearance: Normal appearance.  HENT:     Head: Normocephalic and atraumatic.     Right Ear: External ear normal.     Left Ear: External ear normal.  Eyes:     General: No scleral  icterus.       Right eye: No discharge.        Left eye: No discharge.     Conjunctiva/sclera: Conjunctivae normal.  Neck:     Thyroid: No thyromegaly.  Cardiovascular:     Rate and Rhythm: Normal rate and regular rhythm.  Pulmonary:     Effort: No respiratory distress.     Breath sounds: Normal breath sounds. No wheezing.  Abdominal:     General: Bowel sounds are normal.     Palpations: Abdomen is soft.     Tenderness: There is no abdominal tenderness.  Musculoskeletal:        General: No swelling or tenderness.     Cervical back: Neck supple.  Lymphadenopathy:     Cervical: No cervical  adenopathy.  Skin:    Findings: No erythema or rash.  Neurological:     Mental Status: She is alert.  Psychiatric:        Mood and Affect: Mood normal.        Behavior: Behavior normal.     BP 128/70   Pulse 95   Temp 98.3 F (36.8 C) (Oral)   Resp 16   Ht 5\' 2"  (1.575 m)   Wt 108 lb 9.6 oz (49.3 kg)   LMP 04/19/1999   SpO2 99%   BMI 19.86 kg/m  Wt Readings from Last 3 Encounters:  06/10/20 108 lb 9.6 oz (49.3 kg)  03/10/20 111 lb 9.6 oz (50.6 kg)  12/29/19 118 lb 6.4 oz (53.7 kg)     Lab Results  Component Value Date   WBC 6.2 12/29/2019   HGB 13.2 12/29/2019   HCT 39.6 12/29/2019   PLT 252.0 12/29/2019   GLUCOSE 93 12/29/2019   CHOL 182 12/29/2019   TRIG 138.0 12/29/2019   HDL 58.90 12/29/2019   LDLCALC 95 12/29/2019   ALT 15 12/29/2019   AST 26 12/29/2019   NA 140 12/29/2019   K 4.3 12/29/2019   CL 104 12/29/2019   CREATININE 1.00 12/29/2019   BUN 15 12/29/2019   CO2 32 12/29/2019   TSH 1.53 07/09/2019    CT Abdomen Pelvis W Contrast  Result Date: 01/22/2020 CLINICAL DATA:  Abdominal distension, fullness EXAM: CT ABDOMEN AND PELVIS WITH CONTRAST TECHNIQUE: Multidetector CT imaging of the abdomen and pelvis was performed using the standard protocol following bolus administration of intravenous contrast. CONTRAST:  51mL OMNIPAQUE IOHEXOL 300 MG/ML  SOLN  COMPARISON:  None. FINDINGS: Lower chest: 3 mm nodule partially imaged on the 1st image in the left lung base. No effusions. Heart is normal size. Hepatobiliary: There appear to be serosal masses/implants noted along the liver surface adjacent to the inferior right hepatic lobe with index serosal nodule measuring 2.2 cm on image 29. Gallbladder unremarkable. No intrahepatic abnormality. Pancreas: No focal abnormality or ductal dilatation. Spleen: No focal abnormality.  Normal size. Adrenals/Urinary Tract: 3.2 cm cyst in the upper pole of the left kidney. No hydronephrosis. No adrenal mass. Urinary bladder unremarkable. Stomach/Bowel: Sigmoid diverticulosis. No active diverticulitis. Normal appendix. Stomach and small bowel decompressed, unremarkable. Vascular/Lymphatic: No evidence of aneurysm or adenopathy. Reproductive: Large mass noted within the pelvis which appears to be centered in the right adnexal region. This mass measures 10.6 x 9.2 x 8.5 cm. Mixed solid and cystic components. This extends across the midline and into the cul-de-sac. This is concerning for ovarian cancer. Other: Free fluid adjacent to the liver and spleen. There appears to be omental thickening and nodularity within the right abdomen and right lower quadrant concerning for peritoneal spread of tumor. Musculoskeletal: No acute bony abnormality or focal bone lesion. Degenerative changes in the lumbar spine. IMPRESSION: Large right adnexal pelvic mass measuring up to 10.6 cm. This is complex solid and cystic and is concerning for ovarian cancer. Associated omental thickening, serosal implants along the liver surface, and ascites. Sigmoid diverticulosis. 3 mm left lower lobe pulmonary nodule partially imaged on the 1st image. These results will be called to the ordering clinician or representative by the Radiologist Assistant, and communication documented in the PACS or [employer]. Electronically Signed   By: Rolm Baptise M.D.   On:  01/22/2020 21:57       Assessment & Plan:   Problem List Items Addressed This Visit  Environmental allergies    Controlled.        Ovarian cancer [facility])    Being followed by oncology.  Receiving chemotherapy.  Doing well.  Tolerating.  Follow.       Stress    Overall appears to be handling things relatively well.  Has good support.  Continue wellbutrin.  Follow.           Einar Pheasant, MD

## 2020-06-19 ENCOUNTER — Encounter: Payer: Self-pay | Admitting: Internal Medicine

## 2020-06-19 NOTE — Assessment & Plan Note (Signed)
Overall appears to be handling things relatively well.  Has good support.  Continue wellbutrin.  Follow.

## 2020-06-19 NOTE — Assessment & Plan Note (Signed)
Being followed by oncology.  Receiving chemotherapy.  Doing well.  Tolerating.  Follow.

## 2020-06-19 NOTE — Assessment & Plan Note (Signed)
Controlled.  

## 2020-07-26 ENCOUNTER — Ambulatory Visit: Payer: Medicare HMO

## 2020-08-05 ENCOUNTER — Telehealth: Payer: Self-pay | Admitting: Internal Medicine

## 2020-08-05 NOTE — Telephone Encounter (Signed)
Left message for patient to call back and schedule Medicare Annual Wellness Visit (AWV) in office.   If not able to come in office, please offer to do virtually or by telephone.   Last AWV:07/24/19  Please schedule at anytime with Nurse Health Advisor.

## 2020-08-16 ENCOUNTER — Telehealth: Payer: Self-pay | Admitting: Internal Medicine

## 2020-08-16 NOTE — Telephone Encounter (Signed)
Left message for patient to call back and schedule Medicare Annual Wellness Visit (AWV) in office.   If not able to come in office, please offer to do virtually or by telephone.   Last AWV:07/24/2019   Please schedule at anytime with Nurse Health Advisor.

## 2020-08-20 ENCOUNTER — Other Ambulatory Visit: Payer: Self-pay | Admitting: Internal Medicine

## 2020-09-13 ENCOUNTER — Other Ambulatory Visit: Payer: Self-pay | Admitting: Internal Medicine

## 2020-09-15 ENCOUNTER — Ambulatory Visit (INDEPENDENT_AMBULATORY_CARE_PROVIDER_SITE_OTHER): Payer: Medicare HMO

## 2020-09-15 VITALS — Ht 62.0 in | Wt 108.0 lb

## 2020-09-15 DIAGNOSIS — Z0000 Encounter for general adult medical examination without abnormal findings: Secondary | ICD-10-CM

## 2020-09-15 NOTE — Patient Instructions (Addendum)
[name] , Thank you for taking time to come for your Medicare Wellness Visit. I appreciate your ongoing commitment to your health goals. Please review the following plan we discussed and let me know if I can assist you in the future.   These are the goals we discussed:  Goals       Patient Stated     Maintain Healthy Lifestyle (pt-stated)        This is a list of the screening recommended for you and due dates:  Health Maintenance  Topic Date Due   COVID-19 Vaccine (3 - Moderna risk series) 10/01/2020*   Zoster (Shingles) Vaccine (1 of 2) 12/16/2020*   Mammogram  12/31/2020   Tetanus Vaccine  09/01/2023   Colon Cancer Screening  10/19/2024   DEXA scan (bone density measurement)  Completed   Hepatitis C Screening: USPSTF Recommendation to screen - Ages 34-79 yo.  Completed   Pneumonia vaccines  Completed   HPV Vaccine  Aged Out   Flu Shot  Discontinued  *Topic was postponed. The date shown is not the original due date.     Advanced directives: not yet completed  Conditions/risks identified: none new  Next appointment: Follow up in one year for your annual wellness visit    Preventive Care 65 Years and Older, Female Preventive care refers to lifestyle choices and visits with your health care provider that can promote health and wellness. What does preventive care include? A yearly physical exam. This is also called an annual well check. Dental exams once or twice a year. Routine eye exams. Ask your health care provider how often you should have your eyes checked. Personal lifestyle choices, including: Daily care of your teeth and gums. Regular physical activity. Eating a healthy diet. Avoiding tobacco and drug use. Limiting alcohol use. Practicing safe sex. Taking low-dose aspirin every day. Taking vitamin and mineral supplements as recommended by your health care provider. What happens during an annual well check? The services and screenings done by your health  care provider during your annual well check will depend on your age, overall health, lifestyle risk factors, and family history of disease. Counseling  Your health care provider may ask you questions about your: Alcohol use. Tobacco use. Drug use. Emotional well-being. Home and relationship well-being. Sexual activity. Eating habits. History of falls. Memory and ability to understand (cognition). Work and work Statistician. Reproductive health. Screening  You may have the following tests or measurements: Height, weight, and BMI. Blood pressure. Lipid and cholesterol levels. These may be checked every 5 years, or more frequently if you are over 16 years old. Skin check. Lung cancer screening. You may have this screening every year starting at age 92 if you have a 30-pack-year history of smoking and currently smoke or have quit within the past 15 years. Fecal occult blood test (FOBT) of the stool. You may have this test every year starting at age 40. Flexible sigmoidoscopy or colonoscopy. You may have a sigmoidoscopy every 5 years or a colonoscopy every 10 years starting at age 72. Hepatitis C blood test. Hepatitis B blood test. Sexually transmitted disease (STD) testing. Diabetes screening. This is done by checking your blood sugar (glucose) after you have not eaten for a while (fasting). You may have this done every 1-3 years. Bone density scan. This is done to screen for osteoporosis. You may have this done starting at age 72. Mammogram. This may be done every 1-2 years. Talk to your health care provider about  how often you should have regular mammograms. Talk with your health care provider about your test results, treatment options, and if necessary, the need for more tests. Vaccines  Your health care provider may recommend certain vaccines, such as: Influenza vaccine. This is recommended every year. Tetanus, diphtheria, and acellular pertussis (Tdap, Td) vaccine. You may need a Td  booster every 10 years. Zoster vaccine. You may need this after age 72. Pneumococcal 13-valent conjugate (PCV13) vaccine. One dose is recommended after age 72. Pneumococcal polysaccharide (PPSV23) vaccine. One dose is recommended after age 72. Talk to your health care provider about which screenings and vaccines you need and how often you need them. This information is not intended to replace advice given to you by your health care provider. Make sure you discuss any questions you have with your health care provider. Document Released: 04/15/2015 Document Revised: 12/07/2015 Document Reviewed: 01/18/2015 Elsevier Interactive Patient Education  2017 [place] Prevention in the Home Falls can cause injuries. They can happen to people of all ages. There are many things you can do to make your home safe and to help prevent falls. What can I do on the outside of my home? Regularly fix the edges of walkways and driveways and fix any cracks. Remove anything that might make you trip as you walk through a door, such as a raised step or threshold. Trim any bushes or trees on the path to your home. Use bright outdoor lighting. Clear any walking paths of anything that might make someone trip, such as rocks or tools. Regularly check to see if handrails are loose or broken. Make sure that both sides of any steps have handrails. Any raised decks and porches should have guardrails on the edges. Have any leaves, snow, or ice cleared regularly. Use sand or salt on walking paths during winter. Clean up any spills in your garage right away. This includes oil or grease spills. What can I do in the bathroom? Use night lights. Install grab bars by the toilet and in the tub and shower. Do not use towel bars as grab bars. Use non-skid mats or decals in the tub or shower. If you need to sit down in the shower, use a plastic, non-slip stool. Keep the floor dry. Clean up any water that spills on the floor  as soon as it happens. Remove soap buildup in the tub or shower regularly. Attach bath mats securely with double-sided non-slip rug tape. Do not have throw rugs and other things on the floor that can make you trip. What can I do in the bedroom? Use night lights. Make sure that you have a light by your bed that is easy to reach. Do not use any sheets or blankets that are too big for your bed. They should not hang down onto the floor. Have a firm chair that has side arms. You can use this for support while you get dressed. Do not have throw rugs and other things on the floor that can make you trip. What can I do in the kitchen? Clean up any spills right away. Avoid walking on wet floors. Keep items that you use a lot in easy-to-reach places. If you need to reach something above you, use a strong step stool that has a grab bar. Keep electrical cords out of the way. Do not use floor polish or wax that makes floors slippery. If you must use wax, use non-skid floor wax. Do not have throw rugs and other  things on the floor that can make you trip. What can I do with my stairs? Do not leave any items on the stairs. Make sure that there are handrails on both sides of the stairs and use them. Fix handrails that are broken or loose. Make sure that handrails are as long as the stairways. Check any carpeting to make sure that it is firmly attached to the stairs. Fix any carpet that is loose or worn. Avoid having throw rugs at the top or bottom of the stairs. If you do have throw rugs, attach them to the floor with carpet tape. Make sure that you have a light switch at the top of the stairs and the bottom of the stairs. If you do not have them, ask someone to add them for you. What else can I do to help prevent falls? Wear shoes that: Do not have high heels. Have rubber bottoms. Are comfortable and fit you well. Are closed at the toe. Do not wear sandals. If you use a stepladder: Make sure that it is  fully opened. Do not climb a closed stepladder. Make sure that both sides of the stepladder are locked into place. Ask someone to hold it for you, if possible. Clearly mark and make sure that you can see: Any grab bars or handrails. First and last steps. Where the edge of each step is. Use tools that help you move around (mobility aids) if they are needed. These include: Canes. Walkers. Scooters. Crutches. Turn on the lights when you go into a dark area. Replace any light bulbs as soon as they burn out. Set up your furniture so you have a clear path. Avoid moving your furniture around. If any of your floors are uneven, fix them. If there are any pets around you, be aware of where they are. Review your medicines with your doctor. Some medicines can make you feel dizzy. This can increase your chance of falling. Ask your doctor what other things that you can do to help prevent falls. This information is not intended to replace advice given to you by your health care provider. Make sure you discuss any questions you have with your health care provider. Document Released: 01/13/2009 Document Revised: 08/25/2015 Document Reviewed: 04/23/2014 Elsevier Interactive Patient Education  2017 [employer].

## 2020-09-15 NOTE — Progress Notes (Signed)
Subjective:   Yolanda Perkins is a 70 y.o. female who presents for Medicare Annual (Subsequent) preventive examination.  Review of Systems    No ROS.  Medicare Wellness Virtual Visit.  Visual/audio telehealth visit, UTA vital signs.   See social history for additional risk factors.   Cardiac Risk Factors include: advanced age (>63men, >26 women)     Objective:    Today's Vitals   09/15/20 0911  Weight: 108 lb (49 kg)  Height: 5\' 2"  (1.575 m)   Body mass index is 19.75 kg/m.  Advanced Directives 09/15/2020 07/24/2019 07/23/2018  Does Patient Have a Medical Advance Directive? No Yes No  Does patient want to make changes to medical advance directive? - No - Patient declined -  Would patient like information on creating a medical advance directive? No - Patient declined - No - Patient declined    Current Medications (verified) Outpatient Encounter Medications as of 09/15/2020  Medication Sig   melatonin 3 MG TABS tablet Take 3 mg by mouth at bedtime.   azelastine (ASTELIN) 0.1 % nasal spray Place 1 spray into both nostrils 2 (two) times daily. Use in each nostril as directed   buPROPion (WELLBUTRIN SR) 150 MG 12 hr tablet Take 1 tablet (150 mg total) by mouth daily.   Calcium Carbonate-Vit D-Min 600-400 MG-UNIT TABS Take 1 tablet by mouth 2 (two) times daily.   Calcium Carbonate-Vitamin D 600-400 MG-UNIT tablet Take by mouth.   Cholecalciferol (VITAMIN D3) 1000 UNITS CAPS Take 1 capsule by mouth daily.   dexamethasone (DECADRON) 4 MG tablet Take 2 tablets (8 mg) by mouth daily with food for 3 days starting the day after chemotherapy each cycle to prevent nausea.   estradiol (ESTRACE) 0.1 MG/GM vaginal cream Use as directed.   fluticasone (FLONASE) 50 MCG/ACT nasal spray Place into the nose.   loratadine (CLARITIN) 10 MG tablet Take by mouth.   meloxicam (MOBIC) 7.5 MG tablet Take 1 tablet (7.5 mg total) by mouth daily as needed for pain. With meals   metaxalone (SKELAXIN) 800 MG  tablet 1/2 - 1 tablet q pm prn.   metroNIDAZOLE (METROCREAM) 0.75 % cream metronidazole 0.75 % topical cream   Misc Natural Products (GLUCOSAMINE-CHONDROITIN SULF) TABS Take 1 tablet by mouth daily.   Multiple Vitamin (MULTIVITAMIN) tablet Take 1 tablet by mouth daily.   omeprazole (PRILOSEC) 20 MG capsule Take 1 capsule (20 mg total) by mouth daily.   ondansetron (ZOFRAN) 8 MG tablet Take by mouth.   prochlorperazine (COMPAZINE) 5 MG tablet Take 1 tablet (5 mg) by mouth every 6 hours as needed for nausea if nausea not controlled.   pyridoxine (B-6) 100 MG tablet Take 100 mg by mouth daily.   raloxifene (EVISTA) 60 MG tablet Take 1 tablet (60 mg total) by mouth daily.   SUMAtriptan (IMITREX) 50 MG tablet Take 1/2 tablet my mouth as  directed as needed.   triamcinolone cream (KENALOG) 0.1 % Apply 1 application topically 2 (two) times daily.   No facility-administered encounter medications on file as of 09/15/2020.    Allergies (verified) Penicillins   History: Past Medical History:  Diagnosis Date   Chicken pox    Depression    Migraines    Urinary tract bacterial infections    Past Surgical History:  Procedure Laterality Date   HERNIA REPAIR  1962   TONSILLECTOMY AND ADENOIDECTOMY  1954   Family History  Problem Relation Age of Onset   Arthritis Mother  rheumatoid -which is in remission   Osteoporosis Mother    Transient ischemic attack Mother    Congestive Heart Failure Mother    Diverticulitis Mother    Ovarian cancer Mother    Macular degeneration Mother    Heart disease Father        s/p bypass surgery   Macular degeneration Father    Breast cancer Maternal Aunt    Uterine cancer Maternal Aunt    Uterine cancer Maternal Grandmother    Breast cancer Maternal Aunt    Breast cancer Cousin 62   Social History   Socioeconomic History   Marital status: Married    Spouse name: Not on file   Number of children: Not on file   Years of education: Not on file    Highest education level: Not on file  Occupational History   Not on file  Tobacco Use   Smoking status: Never   Smokeless tobacco: Never  Substance and Sexual Activity   Alcohol use: No    Alcohol/week: 0.0 standard drinks   Drug use: No   Sexual activity: Not on file  Other Topics Concern   Not on file  Social History Narrative   She is married and has 2 daughters.   Social Determinants of Health   Financial Resource Strain: Low Risk    Difficulty of Paying Living Expenses: Not hard at all  Food Insecurity: No Food Insecurity   Worried About Charity fundraiser in the Last Year: Never true   [place] in the Last Year: Never true  Transportation Needs: No Transportation Needs   Lack of Transportation (Medical): No   Lack of Transportation (Non-Medical): No  Physical Activity: Sufficiently Active   Days of Exercise per Week: 7 days   Minutes of Exercise per Session: 30 min  Stress: No Stress Concern Present   Feeling of Stress : Not at all  Social Connections: Unknown   Frequency of Communication with Friends and Family: Not on file   Frequency of Social Gatherings with Friends and Family: Not on file   Attends Religious Services: Not on Electrical engineer or Organizations: Not on file   Attends Archivist Meetings: Not on file   Marital Status: Married    Tobacco Counseling Counseling given: Not Answered   Clinical Intake:  Pre-visit preparation completed: Yes        Diabetes: No  How often do you need to have someone help you when you read instructions, pamphlets, or other written materials from your doctor or pharmacy?: 1 - Never   Interpreter Needed?: No      Activities of Daily Living In your present state of health, do you have any difficulty performing the following activities: 09/15/2020  Hearing? N  Vision? N  Difficulty concentrating or making decisions? N  Walking or climbing stairs? N  Dressing or bathing? N   Doing errands, shopping? N  Preparing Food and eating ? N  Using the Toilet? N  In the past six months, have you accidently leaked urine? N  Do you have problems with loss of bowel control? N  Managing your Medications? N  Managing your Finances? N  Housekeeping or managing your Housekeeping? N  Some recent data might be hidden    Patient Care Team: Einar Pheasant, MD as PCP - General (Internal Medicine)  Indicate any recent Medical Services you may have received from other than [facility] providers in the past year (  date may be approximate).     Assessment:   This is a routine wellness examination for Michalle.  I connected with Mandeep today by telephone and verified that I am speaking with the correct person using two identifiers. Location patient: home Location provider: work Persons participating in the virtual visit: patient, Marine scientist.    I discussed the limitations, risks, security and privacy concerns of performing an evaluation and management service by telephone and the availability of in person appointments. The patient expressed understanding and verbally consented to this telephonic visit.    Interactive audio and video telecommunications were attempted between this provider and patient, however failed, due to patient having technical difficulties OR patient did not have access to video capability.  We continued and completed visit with audio only.  Some vital signs may be absent or patient reported.   Hearing/Vision screen Hearing Screening - Comments:: Patient is able to hear conversational tones without difficulty.  No issues reported. Vision Screening - Comments:: Follow[facility] by [facility] ([place])  Wears corrective lenses  Annual visits  Virtual visit, she has regular follow up with the ophthalmologist  Dietary issues and exercise activities discussed: Current Exercise Habits: Home exercise routine, Intensity: Mild Healthy diet Good water intake   Goals Addressed                This Visit's Progress     Patient Stated     Maintain Healthy Lifestyle (pt-stated)         Depression Screen PHQ 2/9 Scores 09/15/2020 03/10/2020 07/24/2019 07/23/2018 12/31/2016 12/31/2016 12/23/2015  PHQ - 2 Score 0 0 0 0 0 0 0  PHQ- 9 Score - - - - 1 - -    Fall Risk Fall Risk  09/15/2020 03/10/2020 07/24/2019 07/23/2018 12/31/2016  Falls in the past year? 0 0 0 0 No  Number falls in past yr: 0 0 - - -  Injury with Fall? 0 0 - - -  Follow up Falls evaluation completed Falls evaluation completed Falls evaluation completed - -    FALL RISK PREVENTION PERTAINING TO THE HOME: Handrails in use when climbing stairs?Yes Home free of loose throw rugs in walkways, pet beds, electrical cords, etc? Yes  Adequate lighting in your home to reduce risk of falls? Yes   ASSISTIVE DEVICES UTILIZED TO PREVENT FALLS: Life alert? No  Use of a cane, walker or w/c? No   TIMED UP AND GO: Was the test performed? No .   Cognitive Function:  Patient is alert and oriented x3.  Denies difficulty focusing, making decisions, memory loss.  MMSE/6CIT deferred. Normal by direct communication/observation.    6CIT Screen 07/24/2019 07/23/2018  What Year? 0 points 0 points  What month? 0 points 0 points  What time? 0 points 0 points  Count back from 20 - 0 points  Months in reverse 0 points 0 points  Repeat phrase - 0 points  Total Score - 0    Immunizations Immunization History  Administered Date(s) Administered   Fluad Quad(high Dose 65+) 12/25/2018   Hepatitis B 04/03/1991   Influenza Split 01/17/2012   Influenza, High Dose Seasonal PF 04/23/2016, 02/04/2018   Influenza,inj,Quad PF,6+ Mos 12/21/2013, 01/25/2015   Influenza-Unspecified 12/21/2013, 01/25/2015, 04/23/2016, 03/04/2017, 01/01/2020, 01/20/2020   Moderna Sars-Covid-2 Vaccination 06/03/2019, 07/02/2019   Pneumococcal Conjugate-13 07/27/2014   Pneumococcal Polysaccharide-23 03/11/2018   Tdap 08/31/2013   Zoster, Live  05/27/2015   Health Maintenance Health Maintenance  Topic Date Due   COVID-19 Vaccine (3 -  Moderna risk series) 10/01/2020 (Originally 07/30/2019)   Zoster Vaccines- Shingrix (1 of 2) 12/16/2020 (Originally 06/16/1969)   MAMMOGRAM  12/31/2020   TETANUS/TDAP  09/01/2023   COLONOSCOPY (Pts 45-11yrs Insurance coverage will need to be confirmed)  10/19/2024   DEXA SCAN  Completed   Hepatitis C Screening  Completed   PNA vac Low Risk Adult  Completed   HPV VACCINES  Aged Out   INFLUENZA VACCINE  Discontinued   Lung Cancer Screening: (Low Dose CT Chest recommended if Age 17-80 years, 30 pack-year currently smoking OR have quit w/in 15years.) does not qualify.   Shingles vaccine- deferred.   Vision Screening: Recommended annual ophthalmology exams for early detection of glaucoma and other disorders of the eye.  Dental Screening: Recommended annual dental exams for proper oral hygiene  Community Resource Referral / Chronic Care Management: CRR required this visit?  No   CCM required this visit?  No      Plan:   Keep all routine maintenance appointments.   I have personally reviewed and noted the following in the patient's chart:   Medical and social history Use of alcohol, tobacco or illicit drugs  Current medications and supplements including opioid prescriptions.  Functional ability and status Nutritional status Physical activity Advanced directives List of other physicians Hospitalizations, surgeries, and ER visits in previous 12 months Vitals Screenings to include cognitive, depression, and falls Referrals and appointments  In addition, I have reviewed and discussed with patient certain preventive protocols, quality metrics, and best practice recommendations. A written personalized care plan for preventive services as well as general preventive health recommendations were provided to patient via mychart.     Varney Biles, LPN   7/74/1423

## 2020-10-10 ENCOUNTER — Other Ambulatory Visit: Payer: Self-pay

## 2020-10-10 ENCOUNTER — Encounter: Payer: Self-pay | Admitting: Internal Medicine

## 2020-10-10 ENCOUNTER — Ambulatory Visit: Payer: Medicare HMO | Admitting: Internal Medicine

## 2020-10-10 DIAGNOSIS — C569 Malignant neoplasm of unspecified ovary: Secondary | ICD-10-CM

## 2020-10-10 DIAGNOSIS — F439 Reaction to severe stress, unspecified: Secondary | ICD-10-CM | POA: Diagnosis not present

## 2020-10-10 MED ORDER — OMEPRAZOLE 20 MG PO CPDR: 20.0000 mg | DELAYED_RELEASE_CAPSULE | Freq: Every day | ORAL | 1 refills | Status: DC

## 2020-10-10 NOTE — Progress Notes (Signed)
Patient ID: Yolanda Perkins, female   DOB: Nov 12, 1950, 70 y.o.   MRN: [id number]   Subjective:    Patient ID: Yolanda Perkins, female    DOB: [birth year], 70 y.o.   MRN: [id number]  HPI This visit occurred during the SARS-CoV-2 public health emergency.  Safety protocols were in place, including screening questions prior to the visit, additional usage of staff PPE, and extensive cleaning of exam room while observing appropriate contact time as indicated for disinfecting solutions.   Patient here for a scheduled follow up. 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  This was followed by optimal interval debulking on 06/02/2020.  Treated with chemotherapy.  Had CT scan 08/17/2019 that revealed no definitive metastatic disease.  Small nonobstructing right gonadal vein thrombus which can be an expected finding status post hysterectomy.  Felt no further intervention warranted.  Was started on aspirin daily.  Overall doing better.  Energy is better.  Handling stress.  Has good support.  No chest pain reported.  Breathing stable.  No increased cough or congestion.  No increased abdominal pain reported.  No bowel issues reported.  No sick contacts.  No fever.  No nausea or vomiting.  Past Medical History:  Diagnosis Date   Chicken pox    Depression    Migraines    Urinary tract bacterial infections    Past Surgical History:  Procedure Laterality Date   HERNIA REPAIR  1962   TONSILLECTOMY AND ADENOIDECTOMY  1954   Family History  Problem Relation Age of Onset   Arthritis Mother        rheumatoid -which is in remission   Osteoporosis Mother    Transient ischemic attack Mother    Congestive Heart Failure Mother    Diverticulitis Mother    Ovarian cancer Mother    Macular degeneration Mother    Heart disease Father        s/p bypass surgery   Macular degeneration Father    Breast cancer Maternal Aunt    Uterine cancer Maternal  Aunt    Uterine cancer Maternal Grandmother    Breast cancer Maternal Aunt    Breast cancer Cousin 62   Social History   Socioeconomic History   Marital status: Married    Spouse name: Not on file   Number of children: Not on file   Years of education: Not on file   Highest education level: Not on file  Occupational History   Not on file  Tobacco Use   Smoking status: Never   Smokeless tobacco: Never  Substance and Sexual Activity   Alcohol use: No    Alcohol/week: 0.0 standard drinks   Drug use: No   Sexual activity: Not on file  Other Topics Concern   Not on file  Social History Narrative   She is married and has 2 daughters.   Social Determinants of Health   Financial Resource Strain: Low Risk    Difficulty of Paying Living Expenses: Not hard at all  Food Insecurity: No Food Insecurity   Worried About Charity fundraiser in the Last Year: Never true   [place] in the Last Year: Never true  Transportation Needs: No Transportation Needs   Lack of Transportation (Medical): No   Lack of Transportation (Non-Medical): No  Physical Activity: Sufficiently Active   Days of Exercise per Week: 7 days   Minutes of Exercise per Session: 30  min  Stress: No Stress Concern Present   Feeling of Stress : Not at all  Social Connections: Unknown   Frequency of Communication with Friends and Family: Not on file   Frequency of Social Gatherings with Friends and Family: Not on file   Attends Religious Services: Not on file   Active Member of Clubs or Organizations: Not on file   Attends Archivist Meetings: Not on file   Marital Status: Married    Review of Systems  Constitutional:  Negative for fatigue and unexpected weight change.  HENT:  Negative for sinus pressure.   Respiratory:  Negative for cough, chest tightness and shortness of breath.   Cardiovascular:  Negative for chest pain and palpitations.  Gastrointestinal:  Negative for abdominal pain, diarrhea,  nausea and vomiting.  Genitourinary:  Negative for difficulty urinating and dysuria.  Musculoskeletal:  Negative for joint swelling and myalgias.  Skin:  Negative for color change and rash.  Neurological:  Negative for dizziness, light-headedness and headaches.  Psychiatric/Behavioral:  Negative for agitation and dysphoric mood.       Objective:    Physical Exam Vitals reviewed.  Constitutional:      General: She is not in acute distress.    Appearance: Normal appearance.  HENT:     Head: Normocephalic and atraumatic.     Right Ear: External ear normal.     Left Ear: External ear normal.  Eyes:     General: No scleral icterus.       Right eye: No discharge.        Left eye: No discharge.     Conjunctiva/sclera: Conjunctivae normal.  Neck:     Thyroid: No thyromegaly.  Cardiovascular:     Rate and Rhythm: Normal rate and regular rhythm.  Pulmonary:     Effort: No respiratory distress.     Breath sounds: Normal breath sounds. No wheezing.  Abdominal:     General: Bowel sounds are normal.     Palpations: Abdomen is soft.     Tenderness: There is no abdominal tenderness.  Musculoskeletal:        General: No swelling or tenderness.     Cervical back: Neck supple. No tenderness.  Lymphadenopathy:     Cervical: No cervical adenopathy.  Skin:    Findings: No erythema or rash.  Neurological:     Mental Status: She is alert.  Psychiatric:        Mood and Affect: Mood normal.        Behavior: Behavior normal.    BP 122/66 (BP Location: Left Arm, Patient Position: Sitting)   Pulse 85   Temp (!) 96.9 F (36.1 C)   Ht 5' 2.01" (1.575 m)   Wt 109 lb 9.6 oz (49.7 kg)   LMP 04/19/1999   SpO2 99%   BMI 20.04 kg/m  Wt Readings from Last 3 Encounters:  10/10/20 109 lb 9.6 oz (49.7 kg)  09/15/20 108 lb (49 kg)  06/10/20 108 lb 9.6 oz (49.3 kg)    Outpatient Encounter Medications as of 10/10/2020  Medication Sig   azelastine (ASTELIN) 0.1 % nasal spray Place 1 spray into  both nostrils 2 (two) times daily. Use in each nostril as directed   buPROPion (WELLBUTRIN SR) 150 MG 12 hr tablet Take 1 tablet (150 mg total) by mouth daily.   Calcium Carbonate-Vit D-Min 600-400 MG-UNIT TABS Take 1 tablet by mouth 2 (two) times daily.   Calcium Carbonate-Vitamin D 600-400 MG-UNIT tablet Take by mouth.  Cholecalciferol (VITAMIN D3) 1000 UNITS CAPS Take 1 capsule by mouth daily.   dexamethasone (DECADRON) 4 MG tablet Take 2 tablets (8 mg) by mouth daily with food for 3 days starting the day after chemotherapy each cycle to prevent nausea.   estradiol (ESTRACE) 0.1 MG/GM vaginal cream Use as directed.   fluticasone (FLONASE) 50 MCG/ACT nasal spray Place into the nose.   loratadine (CLARITIN) 10 MG tablet Take by mouth.   melatonin 3 MG TABS tablet Take 3 mg by mouth at bedtime.   meloxicam (MOBIC) 7.5 MG tablet Take 1 tablet (7.5 mg total) by mouth daily as needed for pain. With meals   metaxalone (SKELAXIN) 800 MG tablet 1/2 - 1 tablet q pm prn.   metroNIDAZOLE (METROCREAM) 0.75 % cream metronidazole 0.75 % topical cream   Misc Natural Products (GLUCOSAMINE-CHONDROITIN SULF) TABS Take 1 tablet by mouth daily.   Multiple Vitamin (MULTIVITAMIN) tablet Take 1 tablet by mouth daily.   ondansetron (ZOFRAN) 8 MG tablet Take by mouth.   prochlorperazine (COMPAZINE) 5 MG tablet Take 1 tablet (5 mg) by mouth every 6 hours as needed for nausea if nausea not controlled.   pyridoxine (B-6) 100 MG tablet Take 100 mg by mouth daily.   raloxifene (EVISTA) 60 MG tablet Take 1 tablet (60 mg total) by mouth daily.   SENNA PLUS 8.6-50 MG tablet    SUMAtriptan (IMITREX) 50 MG tablet Take 1/2 tablet my mouth as  directed as needed.   SUMAtriptan (IMITREX) 50 MG tablet Take by mouth.   triamcinolone cream (KENALOG) 0.1 % Apply 1 application topically 2 (two) times daily.   [DISCONTINUED] omeprazole (PRILOSEC) 20 MG capsule Take 1 capsule (20 mg total) by mouth daily.   omeprazole (PRILOSEC) 20  MG capsule Take 1 capsule (20 mg total) by mouth daily.   No facility-administered encounter medications on file as of 10/10/2020.     Lab Results  Component Value Date   WBC 6.2 12/29/2019   HGB 13.2 12/29/2019   HCT 39.6 12/29/2019   PLT 252.0 12/29/2019   GLUCOSE 93 12/29/2019   CHOL 182 12/29/2019   TRIG 138.0 12/29/2019   HDL 58.90 12/29/2019   LDLCALC 95 12/29/2019   ALT 15 12/29/2019   AST 26 12/29/2019   NA 140 12/29/2019   K 4.3 12/29/2019   CL 104 12/29/2019   CREATININE 1.00 12/29/2019   BUN 15 12/29/2019   CO2 32 12/29/2019   TSH 1.53 07/09/2019    CT Abdomen Pelvis W Contrast  Result Date: 01/22/2020 CLINICAL DATA:  Abdominal distension, fullness EXAM: CT ABDOMEN AND PELVIS WITH CONTRAST TECHNIQUE: Multidetector CT imaging of the abdomen and pelvis was performed using the standard protocol following bolus administration of intravenous contrast. CONTRAST:  5mL OMNIPAQUE IOHEXOL 300 MG/ML  SOLN COMPARISON:  None. FINDINGS: Lower chest: 3 mm nodule partially imaged on the 1st image in the left lung base. No effusions. Heart is normal size. Hepatobiliary: There appear to be serosal masses/implants noted along the liver surface adjacent to the inferior right hepatic lobe with index serosal nodule measuring 2.2 cm on image 29. Gallbladder unremarkable. No intrahepatic abnormality. Pancreas: No focal abnormality or ductal dilatation. Spleen: No focal abnormality.  Normal size. Adrenals/Urinary Tract: 3.2 cm cyst in the upper pole of the left kidney. No hydronephrosis. No adrenal mass. Urinary bladder unremarkable. Stomach/Bowel: Sigmoid diverticulosis. No active diverticulitis. Normal appendix. Stomach and small bowel decompressed, unremarkable. Vascular/Lymphatic: No evidence of aneurysm or adenopathy. Reproductive: Large mass noted within the pelvis  which appears to be centered in the right adnexal region. This mass measures 10.6 x 9.2 x 8.5 cm. Mixed solid and cystic  components. This extends across the midline and into the cul-de-sac. This is concerning for ovarian cancer. Other: Free fluid adjacent to the liver and spleen. There appears to be omental thickening and nodularity within the right abdomen and right lower quadrant concerning for peritoneal spread of tumor. Musculoskeletal: No acute bony abnormality or focal bone lesion. Degenerative changes in the lumbar spine. IMPRESSION: Large right adnexal pelvic mass measuring up to 10.6 cm. This is complex solid and cystic and is concerning for ovarian cancer. Associated omental thickening, serosal implants along the liver surface, and ascites. Sigmoid diverticulosis. 3 mm left lower lobe pulmonary nodule partially imaged on the 1st image. These results will be called to the ordering clinician or representative by the Radiologist Assistant, and communication documented in the PACS or [employer]. Electronically Signed   By: Rolm Baptise M.D.   On: 01/22/2020 21:57       Assessment & Plan:   Problem List Items Addressed This Visit     Ovarian cancer [facility])    Being followed by oncology.  Receiving chemotherapy.  Doing well.  Last CT as outlined.        Stress    Has good support.  Overall appears to be handling things well.  Continue wellbutrin.          Einar Pheasant, MD

## 2020-10-17 ENCOUNTER — Encounter: Payer: Self-pay | Admitting: Internal Medicine

## 2020-10-17 NOTE — Assessment & Plan Note (Signed)
Has good support.  Overall appears to be handling things well.  Continue wellbutrin.

## 2020-10-17 NOTE — Assessment & Plan Note (Signed)
Being followed by oncology.  Receiving chemotherapy.  Doing well.  Last CT as outlined.

## 2020-12-21 ENCOUNTER — Other Ambulatory Visit: Payer: Self-pay | Admitting: Internal Medicine

## 2021-01-02 ENCOUNTER — Other Ambulatory Visit: Payer: Self-pay | Admitting: Internal Medicine

## 2021-01-02 DIAGNOSIS — Z1231 Encounter for screening mammogram for malignant neoplasm of breast: Secondary | ICD-10-CM

## 2021-01-17 ENCOUNTER — Other Ambulatory Visit: Payer: Self-pay

## 2021-01-17 ENCOUNTER — Ambulatory Visit
Admission: RE | Admit: 2021-01-17 | Discharge: 2021-01-17 | Disposition: A | Discharge status: Home / Self Care | Payer: Medicare HMO | Source: Ambulatory Visit | Attending: Internal Medicine | Admitting: Internal Medicine

## 2021-01-17 DIAGNOSIS — Z1231 Encounter for screening mammogram for malignant neoplasm of breast: Secondary | ICD-10-CM | POA: Insufficient documentation

## 2021-02-15 ENCOUNTER — Ambulatory Visit (INDEPENDENT_AMBULATORY_CARE_PROVIDER_SITE_OTHER): Payer: Medicare HMO | Admitting: Internal Medicine

## 2021-02-15 ENCOUNTER — Other Ambulatory Visit: Payer: Self-pay

## 2021-02-15 ENCOUNTER — Encounter: Payer: Self-pay | Admitting: Internal Medicine

## 2021-02-15 VITALS — BP 122/78 | HR 87 | Temp 98.7°F | Ht 62.0 in | Wt 112.6 lb

## 2021-02-15 DIAGNOSIS — Z23 Encounter for immunization: Secondary | ICD-10-CM | POA: Diagnosis not present

## 2021-02-15 DIAGNOSIS — F439 Reaction to severe stress, unspecified: Secondary | ICD-10-CM | POA: Diagnosis not present

## 2021-02-15 DIAGNOSIS — Z0000 Encounter for general adult medical examination without abnormal findings: Secondary | ICD-10-CM | POA: Diagnosis not present

## 2021-02-15 DIAGNOSIS — C569 Malignant neoplasm of unspecified ovary: Secondary | ICD-10-CM | POA: Diagnosis not present

## 2021-02-15 NOTE — Progress Notes (Signed)
Patient ID: LABRESHA MELLOR, female   DOB: [birth year], 70 y.o.   MRN: [id number]   Subjective:    Patient ID: Yolanda Perkins, female    DOB: 1950-10-31, 70 y.o.   MRN: [id number]  This visit occurred during the SARS-CoV-2 public health emergency.  Safety protocols were in place, including screening questions prior to the visit, additional usage of staff PPE, and extensive cleaning of exam room while observing appropriate contact time as indicated for disinfecting solutions.   Patient here for her physical exam.   Chief Complaint  Patient presents with   Annual Exam   .   HPI She is doing relatively well.  Feels good.  Being followed by Dr Theora Gianotti for ovarian cancer.  Increased stress - has f/u CT scan today.  She is eating.  No nausea or vomiting.  Does report constipation.  Taking stool softener 2 bid.  Using fiber laxative q hs.  Helps.  Discussed miralax.  No chest pain or sob.  No increased cough or congestion.    Past Medical History:  Diagnosis Date   Chicken pox    Depression    Migraines    Urinary tract bacterial infections    Past Surgical History:  Procedure Laterality Date   HERNIA REPAIR  1962   TONSILLECTOMY AND ADENOIDECTOMY  1954   Family History  Problem Relation Age of Onset   Arthritis Mother        rheumatoid -which is in remission   Osteoporosis Mother    Transient ischemic attack Mother    Congestive Heart Failure Mother    Diverticulitis Mother    Ovarian cancer Mother    Macular degeneration Mother    Heart disease Father        s/p bypass surgery   Macular degeneration Father    Breast cancer Maternal Aunt    Uterine cancer Maternal Aunt    Uterine cancer Maternal Grandmother    Breast cancer Maternal Aunt    Breast cancer Cousin 62   Social History   Socioeconomic History   Marital status: Married    Spouse name: Not on file   Number of children: Not on file   Years of education: Not on file   Highest education level: Not on file   Occupational History   Not on file  Tobacco Use   Smoking status: Never   Smokeless tobacco: Never  Substance and Sexual Activity   Alcohol use: No    Alcohol/week: 0.0 standard drinks   Drug use: No   Sexual activity: Not on file  Other Topics Concern   Not on file  Social History Narrative   She is married and has 2 daughters.   Social Determinants of Health   Financial Resource Strain: Low Risk    Difficulty of Paying Living Expenses: Not hard at all  Food Insecurity: No Food Insecurity   Worried About Charity fundraiser in the Last Year: Never true   [place] in the Last Year: Never true  Transportation Needs: No Transportation Needs   Lack of Transportation (Medical): No   Lack of Transportation (Non-Medical): No  Physical Activity: Sufficiently Active   Days of Exercise per Week: 7 days   Minutes of Exercise per Session: 30 min  Stress: No Stress Concern Present   Feeling of Stress : Not at all  Social Connections: Unknown   Frequency of Communication with Friends and Family: Not on file   Frequency of Social Gatherings  with Friends and Family: Not on file   Attends Religious Services: Not on file   Active Member of Clubs or Organizations: Not on file   Attends Archivist Meetings: Not on file   Marital Status: Married     Review of Systems  Constitutional:  Negative for appetite change and unexpected weight change.  HENT:  Negative for congestion, sinus pressure and sore throat.   Eyes:  Negative for pain and visual disturbance.  Respiratory:  Negative for cough, chest tightness and shortness of breath.   Cardiovascular:  Negative for chest pain, palpitations and leg swelling.  Gastrointestinal:  Positive for constipation. Negative for abdominal pain, diarrhea, nausea and vomiting.  Genitourinary:  Negative for difficulty urinating and dysuria.  Musculoskeletal:  Negative for joint swelling and myalgias.  Skin:  Negative for color change and  rash.  Neurological:  Negative for dizziness, light-headedness and headaches.  Hematological:  Negative for adenopathy. Does not bruise/bleed easily.  Psychiatric/Behavioral:  Negative for agitation and dysphoric mood.       Objective:     BP 122/78 (BP Location: Left Arm, Patient Position: Sitting, Cuff Size: Normal)   Pulse 87   Temp 98.7 F (37.1 C) (Oral)   Ht 5\' 2"  (1.575 m)   Wt 112 lb 9.6 oz (51.1 kg)   LMP 04/19/1999   SpO2 99%   BMI 20.59 kg/m  Wt Readings from Last 3 Encounters:  02/15/21 112 lb 9.6 oz (51.1 kg)  10/10/20 109 lb 9.6 oz (49.7 kg)  09/15/20 108 lb (49 kg)    Physical Exam Vitals reviewed.  Constitutional:      General: She is not in acute distress.    Appearance: Normal appearance. She is well-developed.  HENT:     Head: Normocephalic and atraumatic.     Right Ear: External ear normal.     Left Ear: External ear normal.  Eyes:     General: No scleral icterus.       Right eye: No discharge.        Left eye: No discharge.     Conjunctiva/sclera: Conjunctivae normal.  Neck:     Thyroid: No thyromegaly.  Cardiovascular:     Rate and Rhythm: Normal rate and regular rhythm.  Pulmonary:     Effort: No tachypnea, accessory muscle usage or respiratory distress.     Breath sounds: Normal breath sounds. No decreased breath sounds or wheezing.  Chest:  Breasts:    Right: No inverted nipple, mass, nipple discharge or tenderness (no axillary adenopathy).     Left: No inverted nipple, mass, nipple discharge or tenderness (no axilarry adenopathy).  Abdominal:     General: Bowel sounds are normal.     Palpations: Abdomen is soft.     Tenderness: There is no abdominal tenderness.  Musculoskeletal:        General: No swelling or tenderness.     Cervical back: Neck supple.  Lymphadenopathy:     Cervical: No cervical adenopathy.  Skin:    Findings: No erythema or rash.  Neurological:     Mental Status: She is alert and oriented to person, place, and  time.  Psychiatric:        Mood and Affect: Mood normal.        Behavior: Behavior normal.     Outpatient Encounter Medications as of 02/15/2021  Medication Sig   buPROPion (WELLBUTRIN SR) 150 MG 12 hr tablet Take 1 tablet (150 mg total) by mouth daily.   Calcium  Carbonate-Vit D-Min 600-400 MG-UNIT TABS Take 1 tablet by mouth 2 (two) times daily.   Cholecalciferol (VITAMIN D3) 1000 UNITS CAPS Take 1 capsule by mouth daily.   estradiol (ESTRACE) 0.1 MG/GM vaginal cream Use as directed.   fluticasone (FLONASE) 50 MCG/ACT nasal spray Place into the nose.   loratadine (CLARITIN) 10 MG tablet Take by mouth.   melatonin 3 MG TABS tablet Take 3 mg by mouth at bedtime.   meloxicam (MOBIC) 7.5 MG tablet Take 1 tablet (7.5 mg total) by mouth daily as needed for pain. With meals   Multiple Vitamin (MULTIVITAMIN) tablet Take 1 tablet by mouth daily.   omeprazole (PRILOSEC) 20 MG capsule Take 1 capsule (20 mg total) by mouth daily.   ondansetron (ZOFRAN) 8 MG tablet Take by mouth.   pyridoxine (B-6) 100 MG tablet Take 100 mg by mouth daily.   raloxifene (EVISTA) 60 MG tablet Take 1 tablet (60 mg total) by mouth daily.   SENNA PLUS 8.6-50 MG tablet    SUMAtriptan (IMITREX) 50 MG tablet Take by mouth.   triamcinolone cream (KENALOG) 0.1 % Apply 1 application topically 2 (two) times daily.   zinc gluconate 50 MG tablet Take 50 mg by mouth daily.   [DISCONTINUED] Calcium Carbonate-Vitamin D 600-400 MG-UNIT tablet Take by mouth.   [DISCONTINUED] azelastine (ASTELIN) 0.1 % nasal spray Place 1 spray into both nostrils 2 (two) times daily. Use in each nostril as directed (Patient not taking: Reported on 02/15/2021)   [DISCONTINUED] dexamethasone (DECADRON) 4 MG tablet Take 2 tablets (8 mg) by mouth daily with food for 3 days starting the day after chemotherapy each cycle to prevent nausea. (Patient not taking: Reported on 02/15/2021)   [DISCONTINUED] metaxalone (SKELAXIN) 800 MG tablet 1/2 - 1 tablet q pm  prn. (Patient not taking: Reported on 02/15/2021)   [DISCONTINUED] metroNIDAZOLE (METROCREAM) 0.75 % cream metronidazole 0.75 % topical cream (Patient not taking: Reported on 02/15/2021)   [DISCONTINUED] Misc Natural Products (GLUCOSAMINE-CHONDROITIN SULF) TABS Take 1 tablet by mouth daily. (Patient not taking: Reported on 02/15/2021)   [DISCONTINUED] prochlorperazine (COMPAZINE) 5 MG tablet Take 1 tablet (5 mg) by mouth every 6 hours as needed for nausea if nausea not controlled. (Patient not taking: Reported on 02/15/2021)   [DISCONTINUED] SUMAtriptan (IMITREX) 50 MG tablet Take 1/2 tablet my mouth as  directed as needed.   No facility-administered encounter medications on file as of 02/15/2021.     Lab Results  Component Value Date   WBC 6.2 12/29/2019   HGB 13.2 12/29/2019   HCT 39.6 12/29/2019   PLT 252.0 12/29/2019   GLUCOSE 93 12/29/2019   CHOL 182 12/29/2019   TRIG 138.0 12/29/2019   HDL 58.90 12/29/2019   LDLCALC 95 12/29/2019   ALT 15 12/29/2019   AST 26 12/29/2019   NA 140 12/29/2019   K 4.3 12/29/2019   CL 104 12/29/2019   CREATININE 1.00 12/29/2019   BUN 15 12/29/2019   CO2 32 12/29/2019   TSH 1.53 07/09/2019    MM 3D SCREEN BREAST BILATERAL  Result Date: 01/21/2021 CLINICAL DATA:  Screening. EXAM: DIGITAL SCREENING BILATERAL MAMMOGRAM WITH TOMOSYNTHESIS AND CAD TECHNIQUE: Bilateral screening digital craniocaudal and mediolateral oblique mammograms were obtained. Bilateral screening digital breast tomosynthesis was performed. The images were evaluated with computer-aided detection. COMPARISON:  Previous exam(s). ACR Breast Density Category b: There are scattered areas of fibroglandular density. FINDINGS: There are no findings suspicious for malignancy. IMPRESSION: No mammographic evidence of malignancy. A result letter of this screening mammogram  will be mailed directly to the patient. RECOMMENDATION: Screening mammogram in one year. (Code:SM-B-01Y) BI-RADS CATEGORY  1:  Negative. Electronically Signed   By: Franki Cabot M.D.   On: 01/21/2021 10:36      Assessment & Plan:   Problem List Items Addressed This Visit     Health care maintenance    Physical today 02/15/21.  Mammogram 01/17/21 - Birads I.  Colonoscopy 72016 - 10 year f/u.       Ovarian cancer [facility])    Being followed by oncology.  Has f/u CT today.  Feels good other than her constipation.  Continue f/u with oncology.       Stress    Has f/u CT today.  Overall appears to be handling things relatively well.  Has good support.  Continue wellbutrin.  Follow.       Other Visit Diagnoses     Routine general medical examination at a health care facility    -  Primary   Need for immunization against influenza       Relevant Orders   Flu Vaccine QUAD High Dose(Fluad) (Completed)        Einar Pheasant, MD

## 2021-02-15 NOTE — Assessment & Plan Note (Signed)
Physical today 02/15/21.  Mammogram 01/17/21 - Birads I.  Colonoscopy 72016 - 10 year f/u.

## 2021-02-20 ENCOUNTER — Encounter: Payer: Self-pay | Admitting: Internal Medicine

## 2021-02-20 NOTE — Assessment & Plan Note (Signed)
Has f/u CT today.  Overall appears to be handling things relatively well.  Has good support.  Continue wellbutrin.  Follow.

## 2021-02-20 NOTE — Assessment & Plan Note (Signed)
Being followed by oncology.  Has f/u CT today.  Feels good other than her constipation.  Continue f/u with oncology.

## 2021-02-22 ENCOUNTER — Other Ambulatory Visit: Payer: Self-pay | Admitting: Internal Medicine

## 2021-03-18 ENCOUNTER — Other Ambulatory Visit: Payer: Self-pay | Admitting: Internal Medicine

## 2021-03-28 ENCOUNTER — Other Ambulatory Visit: Payer: Self-pay | Admitting: Internal Medicine

## 2021-04-19 ENCOUNTER — Other Ambulatory Visit: Payer: Self-pay | Admitting: Internal Medicine

## 2021-04-20 NOTE — Telephone Encounter (Signed)
Just need to clarify a few things.  Please confirm with Russell if she needs meloxicam and if so, how often is she taking and what is she taking it for.

## 2021-04-21 ENCOUNTER — Encounter: Payer: Self-pay | Admitting: Internal Medicine

## 2021-04-21 NOTE — Telephone Encounter (Signed)
LMTCB

## 2021-06-19 ENCOUNTER — Ambulatory Visit: Payer: Medicare HMO | Admitting: Internal Medicine

## 2021-06-19 ENCOUNTER — Other Ambulatory Visit: Payer: Self-pay

## 2021-06-19 DIAGNOSIS — R1310 Dysphagia, unspecified: Secondary | ICD-10-CM

## 2021-06-19 DIAGNOSIS — F439 Reaction to severe stress, unspecified: Secondary | ICD-10-CM | POA: Diagnosis not present

## 2021-06-19 DIAGNOSIS — C569 Malignant neoplasm of unspecified ovary: Secondary | ICD-10-CM

## 2021-06-19 DIAGNOSIS — K5900 Constipation, unspecified: Secondary | ICD-10-CM

## 2021-06-19 NOTE — Patient Instructions (Signed)
Pepcid '20mg'$  - take one tablet 30 minutes before your evening meal.   ?

## 2021-06-19 NOTE — Progress Notes (Signed)
Patient ID: Yolanda Perkins, female   DOB: 1950-12-18, 71 y.o.   MRN: [id number] ? ? ?Subjective:  ? ? Patient ID: Yolanda Perkins, female    DOB: [birth year], 71 y.o.   MRN: [id number] ? ?This visit occurred during the SARS-CoV-2 public health emergency.  Safety protocols were in place, including screening questions prior to the visit, additional usage of staff PPE, and extensive cleaning of exam room while observing appropriate contact time as indicated for disinfecting solutions.  ? ?Patient here for a scheduled follow up.  ? ?Chief Complaint  ?Patient presents with  ? Stress  ? Ovarian Cancer  ? .  ? ?HPI ?Follow up: ovarian cancer - receiving chemo.  Followed by Dr Theora Gianotti.  Intermittent nausea.  Taking stool softener and fiber - bowels ok.  No chest pain.  Breathing stable.  No increased cough or congestion.  Some issues with swallowing.  Unable to swallow multiple pills.  Has to chew meat well.  Increased burping.  Taking prilosec in the am.  Increased stress related to above.  Discussed.  Has good support.  On wellbutrin.  Notify me if feels needs any further intervention.   ? ? ?Past Medical History:  ?Diagnosis Date  ? Chicken pox   ? Depression   ? Migraines   ? Urinary tract bacterial infections   ? ?Past Surgical History:  ?Procedure Laterality Date  ? [place]  ? [place]  ? ?Family History  ?Problem Relation Age of Onset  ? Arthritis Mother   ?     rheumatoid -which is in remission  ? Osteoporosis Mother   ? Transient ischemic attack Mother   ? Congestive Heart Failure Mother   ? Diverticulitis Mother   ? Ovarian cancer Mother   ? Macular degeneration Mother   ? Heart disease Father   ?     s/p bypass surgery  ? Macular degeneration Father   ? Breast cancer Maternal Aunt   ? Uterine cancer Maternal Aunt   ? Uterine cancer Maternal Grandmother   ? Breast cancer Maternal Aunt   ? Breast cancer Cousin 17  ? ?Social History  ? ?Socioeconomic History  ? Marital status: Married   ?  Spouse name: Not on file  ? Number of children: Not on file  ? Years of education: Not on file  ? Highest education level: Not on file  ?Occupational History  ? Not on file  ?Tobacco Use  ? Smoking status: Never  ? Smokeless tobacco: Never  ?Substance and Sexual Activity  ? Alcohol use: No  ?  Alcohol/week: 0.0 standard drinks  ? Drug use: No  ? Sexual activity: Not on file  ?Other Topics Concern  ? Not on file  ?Social History Narrative  ? She is married and has 2 daughters.  ? ?Social Determinants of Health  ? ?Financial Resource Strain: Low Risk   ? Difficulty of Paying Living Expenses: Not hard at all  ?Food Insecurity: No Food Insecurity  ? Worried About Charity fundraiser in the Last Year: Never true  ? Ran Out of Food in the Last Year: Never true  ?Transportation Needs: No Transportation Needs  ? Lack of Transportation (Medical): No  ? Lack of Transportation (Non-Medical): No  ?Physical Activity: Sufficiently Active  ? Days of Exercise per Week: 7 days  ? Minutes of Exercise per Session: 30 min  ?Stress: No Stress Concern Present  ? Feeling of Stress : Not  at all  ?Social Connections: Unknown  ? Frequency of Communication with Friends and Family: Not on file  ? Frequency of Social Gatherings with Friends and Family: Not on file  ? Attends Religious Services: Not on file  ? Active Member of Clubs or Organizations: Not on file  ? Attends Archivist Meetings: Not on file  ? Marital Status: Married  ? ? ? ?Review of Systems  ?Constitutional:  Negative for appetite change and unexpected weight change.  ?HENT:  Negative for congestion and sinus pressure.   ?Respiratory:  Negative for cough, chest tightness and shortness of breath.   ?Cardiovascular:  Negative for chest pain, palpitations and leg swelling.  ?Gastrointestinal:  Negative for abdominal pain, diarrhea, nausea and vomiting.  ?Genitourinary:  Negative for difficulty urinating and dysuria.  ?Musculoskeletal:  Negative for joint swelling and  myalgias.  ?Skin:  Negative for color change and rash.  ?Neurological:  Negative for dizziness, light-headedness and headaches.  ?Psychiatric/Behavioral:  Negative for agitation and dysphoric mood.   ? ?   ?Objective:  ?  ? ?BP 136/74   Pulse 90   Temp 97.6 ?F (36.4 ?C)   Resp 16   Ht '5\' 2"'$  (1.575 m)   Wt 114 lb 6.4 oz (51.9 kg)   LMP 04/19/1999   SpO2 99%   BMI 20.92 kg/m?  ?Wt Readings from Last 3 Encounters:  ?06/19/21 114 lb 6.4 oz (51.9 kg)  ?02/15/21 112 lb 9.6 oz (51.1 kg)  ?10/10/20 109 lb 9.6 oz (49.7 kg)  ? ? ?Physical Exam ?Vitals reviewed.  ?Constitutional:   ?   General: She is not in acute distress. ?   Appearance: Normal appearance.  ?HENT:  ?   Head: Normocephalic and atraumatic.  ?   Right Ear: External ear normal.  ?   Left Ear: External ear normal.  ?Eyes:  ?   General: No scleral icterus.    ?   Right eye: No discharge.     ?   Left eye: No discharge.  ?   Conjunctiva/sclera: Conjunctivae normal.  ?Neck:  ?   Thyroid: No thyromegaly.  ?Cardiovascular:  ?   Rate and Rhythm: Normal rate and regular rhythm.  ?Pulmonary:  ?   Effort: No respiratory distress.  ?   Breath sounds: Normal breath sounds. No wheezing.  ?Abdominal:  ?   General: Bowel sounds are normal.  ?   Palpations: Abdomen is soft.  ?   Tenderness: There is no abdominal tenderness.  ?Musculoskeletal:     ?   General: No swelling or tenderness.  ?   Cervical back: Neck supple. No tenderness.  ?Lymphadenopathy:  ?   Cervical: No cervical adenopathy.  ?Skin: ?   Findings: No erythema or rash.  ?Neurological:  ?   Mental Status: She is alert.  ?Psychiatric:     ?   Mood and Affect: Mood normal.     ?   Behavior: Behavior normal.  ? ? ? ?Outpatient Encounter Medications as of 06/19/2021  ?Medication Sig  ? buPROPion (WELLBUTRIN SR) 150 MG 12 hr tablet Take 1 tablet (150 mg total) by mouth daily.  ? Calcium Carbonate-Vit D-Min 600-400 MG-UNIT TABS Take 1 tablet by mouth 2 (two) times daily.  ? Cholecalciferol (VITAMIN D3) 1000 UNITS  CAPS Take 1 capsule by mouth daily.  ? estradiol (ESTRACE) 0.1 MG/GM vaginal cream Use as directed.  ? fluticasone (FLONASE) 50 MCG/ACT nasal spray Place into the nose.  ? loratadine (CLARITIN) 10 MG  tablet Take by mouth.  ? melatonin 3 MG TABS tablet Take 3 mg by mouth at bedtime.  ? meloxicam (MOBIC) 7.5 MG tablet Take 1 tablet (7.5 mg total) by mouth daily as needed for pain. With meals  ? Multiple Vitamin (MULTIVITAMIN) tablet Take 1 tablet by mouth daily.  ? omeprazole (PRILOSEC) 20 MG capsule Take 1 capsule (20 mg total) by mouth daily.  ? ondansetron (ZOFRAN) 8 MG tablet Take by mouth.  ? pyridoxine (B-6) 100 MG tablet Take 100 mg by mouth daily.  ? raloxifene (EVISTA) 60 MG tablet Take 1 tablet (60 mg total) by mouth daily.  ? SENNA PLUS 8.6-50 MG tablet   ? SUMAtriptan (IMITREX) 50 MG tablet Take 1/2 tablet my mouth as directed as needed.  ? triamcinolone cream (KENALOG) 0.1 % Apply 1 application topically 2 (two) times daily.  ? zinc gluconate 50 MG tablet Take 50 mg by mouth daily.  ? ?No facility-administered encounter medications on file as of 06/19/2021.  ?  ? ?Lab Results  ?Component Value Date  ? WBC 6.2 12/29/2019  ? HGB 13.2 12/29/2019  ? HCT 39.6 12/29/2019  ? PLT 252.0 12/29/2019  ? GLUCOSE 93 12/29/2019  ? CHOL 182 12/29/2019  ? TRIG 138.0 12/29/2019  ? HDL 58.90 12/29/2019  ? [place] 95 12/29/2019  ? ALT 15 12/29/2019  ? AST 26 12/29/2019  ? NA 140 12/29/2019  ? K 4.3 12/29/2019  ? CL 104 12/29/2019  ? CREATININE 1.00 12/29/2019  ? BUN 15 12/29/2019  ? CO2 32 12/29/2019  ? TSH 1.53 07/09/2019  ? ? ?MM 3D SCREEN BREAST BILATERAL ? ?Result Date: 01/21/2021 ?CLINICAL DATA:  Screening. EXAM: DIGITAL SCREENING BILATERAL MAMMOGRAM WITH TOMOSYNTHESIS AND CAD TECHNIQUE: Bilateral screening digital craniocaudal and mediolateral oblique mammograms were obtained. Bilateral screening digital breast tomosynthesis was performed. The images were evaluated with computer-aided detection. COMPARISON:  Previous  exam(s). ACR Breast Density Category b: There are scattered areas of fibroglandular density. FINDINGS: There are no findings suspicious for malignancy. IMPRESSION: No mammographic evidence of malignancy. A result

## 2021-06-25 ENCOUNTER — Encounter: Payer: Self-pay | Admitting: Internal Medicine

## 2021-06-25 DIAGNOSIS — K5900 Constipation, unspecified: Secondary | ICD-10-CM | POA: Insufficient documentation

## 2021-06-25 DIAGNOSIS — R1310 Dysphagia, unspecified: Secondary | ICD-10-CM | POA: Insufficient documentation

## 2021-06-25 NOTE — Assessment & Plan Note (Signed)
Issues with swallowing as outlined.  Discussed chewing food well, small bites and eating slowly. On prilosec in the am.  Add pepcid before evening meal.  Follow.  Call with update.   ?

## 2021-06-25 NOTE — Assessment & Plan Note (Signed)
Stool softener and fiber - controlling.  Follow.  ?

## 2021-06-25 NOTE — Assessment & Plan Note (Signed)
Increased stress as outlined.  On wellbutrin. Has good support.  Follow.  ?

## 2021-06-25 NOTE — Assessment & Plan Note (Signed)
Being followed by oncology.  Currently undergoing treatment.    

## 2021-06-29 ENCOUNTER — Encounter: Payer: Self-pay | Admitting: Internal Medicine

## 2021-07-18 ENCOUNTER — Other Ambulatory Visit: Payer: Self-pay | Admitting: Internal Medicine

## 2021-08-14 ENCOUNTER — Other Ambulatory Visit: Payer: Self-pay | Admitting: Internal Medicine

## 2021-09-18 ENCOUNTER — Ambulatory Visit (INDEPENDENT_AMBULATORY_CARE_PROVIDER_SITE_OTHER): Payer: Medicare HMO

## 2021-09-18 VITALS — Ht 62.0 in | Wt 114.0 lb

## 2021-09-18 DIAGNOSIS — Z0000 Encounter for general adult medical examination without abnormal findings: Secondary | ICD-10-CM

## 2021-09-18 NOTE — Patient Instructions (Addendum)
  Yolanda Perkins , Thank you for taking time to come for your Medicare Wellness Visit. I appreciate your ongoing commitment to your health goals. Please review the following plan we discussed and let me know if I can assist you in the future.   These are the goals we discussed:  Goals       Patient Stated     Increase physiacl activity (pt-stated)      Ride stationary bike        This is a list of the screening recommended for you and due dates:  Health Maintenance  Topic Date Due   Zoster (Shingles) Vaccine (1 of 2) 09/19/2021*   COVID-19 Vaccine (3 - Moderna risk series) 10/26/2021*   Mammogram  01/17/2022   Tetanus Vaccine  09/01/2023   Colon Cancer Screening  10/19/2024   Pneumonia Vaccine  Completed   DEXA scan (bone density measurement)  Completed   Hepatitis C Screening: USPSTF Recommendation to screen - Ages 37-79 yo.  Completed   HPV Vaccine  Aged Out   Flu Shot  Discontinued  *Topic was postponed. The date shown is not the original due date.

## 2021-09-18 NOTE — Progress Notes (Signed)
Subjective:   Yolanda Perkins is a 71 y.o. female who presents for Medicare Annual (Subsequent) preventive examination.  Review of Systems    No ROS.  Medicare Wellness Virtual Visit.  Visual/audio telehealth visit, UTA vital signs.   See social history for additional risk factors.   Cardiac Risk Factors include: advanced age (>98mn, >>80women)     Objective:    Today's Vitals   09/18/21 1104  Weight: 114 lb (51.7 kg)  Height: '5\' 2"'$  (1.575 m)   Body mass index is 20.85 kg/m.     09/18/2021   11:09 AM 09/15/2020    9:16 AM 07/24/2019   10:48 AM 07/23/2018   10:38 AM  Advanced Directives  Does Patient Have a Medical Advance Directive? No No Yes No  Does patient want to make changes to medical advance directive?   No - Patient declined   Would patient like information on creating a medical advance directive? No - Patient declined No - Patient declined  No - Patient declined    Current Medications (verified) Outpatient Encounter Medications as of 09/18/2021  Medication Sig   buPROPion (WELLBUTRIN SR) 150 MG 12 hr tablet Take 1 tablet (150 mg total) by mouth daily.   Calcium Carbonate-Vit D-Min 600-400 MG-UNIT TABS Take 1 tablet by mouth 2 (two) times daily.   Cholecalciferol (VITAMIN D3) 1000 UNITS CAPS Take 1 capsule by mouth daily.   estradiol (ESTRACE) 0.1 MG/GM vaginal cream Use as directed.   fluticasone (FLONASE) 50 MCG/ACT nasal spray Place into the nose.   loratadine (CLARITIN) 10 MG tablet Take by mouth.   melatonin 3 MG TABS tablet Take 3 mg by mouth at bedtime.   meloxicam (MOBIC) 7.5 MG tablet Take 1 tablet (7.5 mg total) by mouth daily as needed for pain. With meals   Multiple Vitamin (MULTIVITAMIN) tablet Take 1 tablet by mouth daily.   omeprazole (PRILOSEC) 20 MG capsule Take 1 capsule (20 mg total) by mouth daily.   ondansetron (ZOFRAN) 8 MG tablet Take by mouth.   pyridoxine (B-6) 100 MG tablet Take 100 mg by mouth daily.   raloxifene (EVISTA) 60 MG tablet  Take 1 tablet (60 mg total) by mouth daily.   SENNA PLUS 8.6-50 MG tablet    SUMAtriptan (IMITREX) 50 MG tablet Take 1/2 tablet my mouth as directed as needed.   triamcinolone cream (KENALOG) 0.1 % Apply 1 application topically 2 (two) times daily.   zinc gluconate 50 MG tablet Take 50 mg by mouth daily.   No facility-administered encounter medications on file as of 09/18/2021.    Allergies (verified) Penicillins   History: Past Medical History:  Diagnosis Date   Chicken pox    Depression    Migraines    Urinary tract bacterial infections    Past Surgical History:  Procedure Laterality Date   HERNIA REPAIR  1962   TONSILLECTOMY AND ADENOIDECTOMY  1954   Family History  Problem Relation Age of Onset   Arthritis Mother        rheumatoid -which is in remission   Osteoporosis Mother    Transient ischemic attack Mother    Congestive Heart Failure Mother    Diverticulitis Mother    Ovarian cancer Mother    Macular degeneration Mother    Heart disease Father        s/p bypass surgery   Macular degeneration Father    Breast cancer Maternal Aunt    Uterine cancer Maternal Aunt    Uterine cancer  Maternal Grandmother    Breast cancer Maternal Aunt    Breast cancer Cousin 62   Social History   Socioeconomic History   Marital status: Married    Spouse name: Not on file   Number of children: Not on file   Years of education: Not on file   Highest education level: Not on file  Occupational History   Not on file  Tobacco Use   Smoking status: Never   Smokeless tobacco: Never  Substance and Sexual Activity   Alcohol use: No    Alcohol/week: 0.0 standard drinks of alcohol   Drug use: No   Sexual activity: Not on file  Other Topics Concern   Not on file  Social History Narrative   She is married and has 2 daughters.   Social Determinants of Health   Financial Resource Strain: Low Risk  (09/18/2021)   Overall Financial Resource Strain (CARDIA)    Difficulty of Paying  Living Expenses: Not hard at all  Food Insecurity: No Food Insecurity (09/18/2021)   Hunger Vital Sign    Worried About Running Out of Food in the Last Year: Never true    Ran Out of Food in the Last Year: Never true  Transportation Needs: No Transportation Needs (09/18/2021)   PRAPARE - Hydrologist (Medical): No    Lack of Transportation (Non-Medical): No  Physical Activity: Sufficiently Active (09/18/2021)   Exercise Vital Sign    Days of Exercise per Week: 5 days    Minutes of Exercise per Session: 30 min  Stress: No Stress Concern Present (09/18/2021)   [place]    Feeling of Stress : Not at all  Social Connections: Unknown (09/18/2021)   Social Connection and Isolation Panel [NHANES]    Frequency of Communication with Friends and Family: Not on file    Frequency of Social Gatherings with Friends and Family: Not on file    Attends Religious Services: Not on file    Active Member of Clubs or Organizations: Not on file    Attends Archivist Meetings: Not on file    Marital Status: Married    Tobacco Counseling Counseling given: Not Answered   Clinical Intake:  Pre-visit preparation completed: Yes        Diabetes: No  How often do you need to have someone help you when you read instructions, pamphlets, or other written materials from your doctor or pharmacy?: 1 - Never Interpreter Needed?: No      Activities of Daily Living    09/18/2021   11:11 AM  In your present state of health, do you have any difficulty performing the following activities:  Hearing? 0  Vision? 0  Difficulty concentrating or making decisions? 0  Walking or climbing stairs? 0  Dressing or bathing? 0  Doing errands, shopping? 0  Preparing Food and eating ? N  Using the Toilet? N  In the past six months, have you accidently leaked urine? N  Do you have problems with loss of bowel control?  N  Managing your Medications? N  Managing your Finances? N  Housekeeping or managing your Housekeeping? N    Patient Care Team: Einar Pheasant, MD as PCP - General (Internal Medicine)  Indicate any recent Medical Services you may have received from other than [facility] providers in the past year (date may be approximate).     Assessment:   This is a routine wellness examination for  Budd Palmer Visit via Telephone Note  I connected with  [name] on 09/18/21 at 11:00 AM EDT by telephone and verified that I am speaking with the correct person using two identifiers.  Persons participating in the virtual visit: patient/Nurse Health Advisor   I discussed the limitations of performing an evaluation and management service by telehealth. We continued and completed visit with audio only. Some vital signs may be absent or patient reported.   Hearing/Vision screen Hearing Screening - Comments:: Patient is able to hear conversational tones without difficulty.  No issues reported. Vision Screening - Comments:: Followed by [facility] ([place])  Wears corrective lenses  Annual visits  She has regular follow up with the ophthalmologist  Dietary issues and exercise activities discussed: Current Exercise Habits: Home exercise routine, Intensity: Mild Regular diet Good water intake   Goals Addressed               This Visit's Progress     Patient Stated     Increase physiacl activity (pt-stated)        Ride stationary bike       Depression Screen    09/18/2021   11:08 AM 10/10/2020   11:34 AM 09/15/2020    9:14 AM 03/10/2020    2:54 PM 07/24/2019   10:49 AM 07/23/2018   10:40 AM 12/31/2016    3:23 PM  PHQ 2/9 Scores  PHQ - 2 Score 0 0 0 0 0 0 0  PHQ- 9 Score       1    Fall Risk    09/18/2021   11:11 AM 10/10/2020   11:33 AM 09/15/2020    9:17 AM 03/10/2020    2:54 PM 07/24/2019   10:43 AM  Fall Risk   Falls in the past year? 0 0 0 0 0  Number falls in past yr:  0 0  0   Injury with Fall?  0 0 0   Follow up Falls evaluation completed Falls evaluation completed Falls evaluation completed Falls evaluation completed Falls evaluation completed    [place]: Home free of loose throw rugs in walkways, pet beds, electrical cords, etc? Yes  Adequate lighting in your home to reduce risk of falls? Yes   ASSISTIVE DEVICES UTILIZED TO PREVENT FALLS: Life alert? No  Use of a cane, walker or w/c? No   TIMED UP AND GO: Was the test performed? No .   Cognitive Function:  Patient is alert and oriented x3.       07/24/2019   10:58 AM 07/23/2018   10:42 AM  6CIT Screen  What Year? 0 points 0 points  What month? 0 points 0 points  What time? 0 points 0 points  Count back from 20  0 points  Months in reverse 0 points 0 points  Repeat phrase  0 points  Total Score  0 points    Immunizations Immunization History  Administered Date(s) Administered   Fluad Quad(high Dose 65+) 12/25/2018, 02/15/2021   Hepatitis B 04/03/1991   Influenza Split 01/17/2012   Influenza, High Dose Seasonal PF 04/23/2016, 02/04/2018   Influenza,inj,Quad PF,6+ Mos 12/21/2013, 01/25/2015   Influenza-Unspecified 12/21/2013, 01/25/2015, 04/23/2016, 03/04/2017, 01/01/2020, 01/20/2020   Moderna Sars-Covid-2 Vaccination 06/03/2019, 07/02/2019   Pneumococcal Conjugate-13 07/27/2014   Pneumococcal Polysaccharide-23 03/11/2018   Tdap 08/31/2013   Zoster, Live 05/27/2015   Screening Tests Health Maintenance  Topic Date Due   Zoster Vaccines- Shingrix (1 of 2) 09/19/2021 (  Originally 06/16/1969)   COVID-19 Vaccine (3 - Moderna risk series) 10/26/2021 (Originally 07/30/2019)   MAMMOGRAM  01/17/2022   TETANUS/TDAP  09/01/2023   COLONOSCOPY (Pts 45-1yr Insurance coverage will need to be confirmed)  10/19/2024   Pneumonia Vaccine 71 Years old  Completed   DEXA SCAN  Completed   Hepatitis C Screening  Completed   HPV VACCINES  Aged Out   INFLUENZA  VACCINE  Discontinued   Health Maintenance There are no preventive care reminders to display for this patient.  Lung Cancer Screening: (Low Dose CT Chest recommended if Age 672-80years, 30 pack-year currently smoking OR have quit w/in 15years.) does not qualify.   Vision Screening: Recommended annual ophthalmology exams for early detection of glaucoma and other disorders of the eye.  Dental Screening: Recommended annual dental exams for proper oral hygiene  Community Resource Referral / Chronic Care Management: CRR required this visit?  No   CCM required this visit?  No      Plan:   Keep all routine maintenance appointments.   Patient notes labs are completed with [facility]. See care everywhere.   I have personally reviewed and noted the following in the patient's chart:   Medical and social history Use of alcohol, tobacco or illicit drugs  Current medications and supplements including opioid prescriptions.  Functional ability and status Nutritional status Physical activity Advanced directives List of other physicians Hospitalizations, surgeries, and ER visits in previous 12 months Vitals Screenings to include cognitive, depression, and falls Referrals and appointments  In addition, I have reviewed and discussed with patient certain preventive protocols, quality metrics, and best practice recommendations. A written personalized care plan for preventive services as well as general preventive health recommendations were provided to patient.     OVarney Biles LPN   [id number]

## 2021-10-19 ENCOUNTER — Ambulatory Visit (INDEPENDENT_AMBULATORY_CARE_PROVIDER_SITE_OTHER): Payer: Medicare HMO | Admitting: Internal Medicine

## 2021-10-19 ENCOUNTER — Encounter: Payer: Self-pay | Admitting: Internal Medicine

## 2021-10-19 VITALS — BP 120/68 | HR 82 | Temp 98.2°F | Resp 15 | Ht 62.0 in | Wt 109.0 lb

## 2021-10-19 DIAGNOSIS — R519 Headache, unspecified: Secondary | ICD-10-CM

## 2021-10-19 DIAGNOSIS — K5900 Constipation, unspecified: Secondary | ICD-10-CM | POA: Diagnosis not present

## 2021-10-19 DIAGNOSIS — R252 Cramp and spasm: Secondary | ICD-10-CM

## 2021-10-19 DIAGNOSIS — Z1231 Encounter for screening mammogram for malignant neoplasm of breast: Secondary | ICD-10-CM | POA: Diagnosis not present

## 2021-10-19 DIAGNOSIS — M25561 Pain in right knee: Secondary | ICD-10-CM | POA: Diagnosis not present

## 2021-10-19 DIAGNOSIS — F439 Reaction to severe stress, unspecified: Secondary | ICD-10-CM

## 2021-10-19 DIAGNOSIS — C569 Malignant neoplasm of unspecified ovary: Secondary | ICD-10-CM | POA: Diagnosis not present

## 2021-10-19 MED ORDER — OMEPRAZOLE 20 MG PO CPDR: 20.0000 mg | DELAYED_RELEASE_CAPSULE | Freq: Every day | ORAL | 1 refills | Status: DC

## 2021-10-19 MED ORDER — MELOXICAM 7.5 MG PO TABS
7.5000 mg | ORAL_TABLET | Freq: Every day | ORAL | 0 refills | Status: DC | PRN
Start: 2021-10-19 — End: 2022-09-13

## 2021-10-19 MED ORDER — MAGNESIUM OXIDE -MG SUPPLEMENT 400 (240 MG) MG PO TABS: 400.0000 mg | ORAL_TABLET | Freq: Every day | ORAL | 3 refills | Status: DC

## 2021-10-19 MED ORDER — BUPROPION HCL 75 MG PO TABS
75.0000 mg | ORAL_TABLET | Freq: Every day | ORAL | 1 refills | Status: DC
Start: 2021-10-19 — End: 2022-01-26

## 2021-10-19 NOTE — Progress Notes (Signed)
Patient ID: Yolanda Perkins, female   DOB: [birth year], 71 y.o.   MRN: [id number]   Subjective:    Patient ID: Yolanda Perkins, female    DOB: Feb 15, 1951, 71 y.o.   MRN: [id number]   Patient here for scheduled follow up .   HPI Here to follow up regarding has been seeing oncology.  On maintenance bevacizumab.  Doing well.  Constipation controlled with fiber and stool softeners.  Has been having right knee pain.  Worsened.  Affecting her walking.  Stiff if sitting for long period of time.  Taking meloxicam 3 days per week. Request referral to ortho.  No chest pain or sob reported.  Some decreased appetite.  No nausea.  Occasional headaches.  Takes 1/4 of imitrex if needed.  Blood pressures 124-138/70-80.  Has been having some leg cramps.  Taking magnesium.  Helping.  Has decreased wellbutrin to 75g q day.  Feels doing ok on this dose.  Handling stress.      Past Medical History:  Diagnosis Date   Chicken pox    Depression    Migraines    Urinary tract bacterial infections    Past Surgical History:  Procedure Laterality Date   HERNIA REPAIR  1962   TONSILLECTOMY AND ADENOIDECTOMY  1954   Family History  Problem Relation Age of Onset   Arthritis Mother        rheumatoid -which is in remission   Osteoporosis Mother    Transient ischemic attack Mother    Congestive Heart Failure Mother    Diverticulitis Mother    Ovarian cancer Mother    Macular degeneration Mother    Heart disease Father        s/p bypass surgery   Macular degeneration Father    Breast cancer Maternal Aunt    Uterine cancer Maternal Aunt    Uterine cancer Maternal Grandmother    Breast cancer Maternal Aunt    Breast cancer Cousin 62   Social History   Socioeconomic History   Marital status: Married    Spouse name: Not on file   Number of children: Not on file   Years of education: Not on file   Highest education level: Not on file  Occupational History   Not on file  Tobacco Use   Smoking status: Never    Smokeless tobacco: Never  Substance and Sexual Activity   Alcohol use: No    Alcohol/week: 0.0 standard drinks of alcohol   Drug use: No   Sexual activity: Not on file  Other Topics Concern   Not on file  Social History Narrative   She is married and has 2 daughters.   Social Determinants of Health   Financial Resource Strain: Low Risk  (09/18/2021)   Overall Financial Resource Strain (CARDIA)    Difficulty of Paying Living Expenses: Not hard at all  Food Insecurity: No Food Insecurity (09/18/2021)   Hunger Vital Sign    Worried About Running Out of Food in the Last Year: Never true    Ran Out of Food in the Last Year: Never true  Transportation Needs: No Transportation Needs (09/18/2021)   PRAPARE - Hydrologist (Medical): No    Lack of Transportation (Non-Medical): No  Physical Activity: Sufficiently Active (09/18/2021)   Exercise Vital Sign    Days of Exercise per Week: 5 days    Minutes of Exercise per Session: 30 min  Stress: No Stress Concern Present (09/18/2021)   [employer]  [facility] [place]    Feeling of Stress : Not at all  Social Connections: Unknown (09/18/2021)   Social Connection and Isolation Panel [NHANES]    Frequency of Communication with Friends and Family: Not on file    Frequency of Social Gatherings with Friends and Family: Not on file    Attends Religious Services: Not on file    Active Member of Clubs or Organizations: Not on file    Attends Archivist Meetings: Not on file    Marital Status: Married     Review of Systems  Constitutional:  Negative for unexpected weight change.       Ome decreased appetite.   HENT:  Negative for congestion and sinus pressure.   Respiratory:  Negative for cough, chest tightness and shortness of breath.   Cardiovascular:  Negative for chest pain, palpitations and leg swelling.  Gastrointestinal:  Negative for abdominal pain, diarrhea,  nausea and vomiting.  Genitourinary:  Negative for difficulty urinating and dysuria.  Musculoskeletal:  Negative for myalgias.       Right knee pain as outlined.   Skin:  Negative for color change and rash.  Neurological:  Positive for headaches. Negative for dizziness.  Psychiatric/Behavioral:  Negative for agitation and dysphoric mood.        Objective:     BP 120/68 (BP Location: Left Arm, Patient Position: Sitting, Cuff Size: Small)   Pulse 82   Temp 98.2 F (36.8 C) (Temporal)   Resp 15   Ht '5\' 2"'$  (1.575 m)   Wt 109 lb (49.4 kg)   LMP 04/19/1999   SpO2 97%   BMI 19.94 kg/m  Wt Readings from Last 3 Encounters:  10/19/21 109 lb (49.4 kg)  09/18/21 114 lb (51.7 kg)  06/19/21 114 lb 6.4 oz (51.9 kg)    Physical Exam Vitals reviewed.  Constitutional:      General: She is not in acute distress.    Appearance: Normal appearance.  HENT:     Head: Normocephalic and atraumatic.     Right Ear: External ear normal.     Left Ear: External ear normal.  Eyes:     General: No scleral icterus.       Right eye: No discharge.        Left eye: No discharge.     Conjunctiva/sclera: Conjunctivae normal.  Neck:     Thyroid: No thyromegaly.  Cardiovascular:     Rate and Rhythm: Normal rate and regular rhythm.  Pulmonary:     Effort: No respiratory distress.     Breath sounds: Normal breath sounds. No wheezing.  Abdominal:     General: Bowel sounds are normal.     Palpations: Abdomen is soft.     Tenderness: There is no abdominal tenderness.  Musculoskeletal:        General: No swelling or tenderness.     Cervical back: Neck supple. No tenderness.  Lymphadenopathy:     Cervical: No cervical adenopathy.  Skin:    Findings: No erythema or rash.  Neurological:     Mental Status: She is alert.  Psychiatric:        Mood and Affect: Mood normal.        Behavior: Behavior normal.      Outpatient Encounter Medications as of 10/19/2021  Medication Sig   Calcium  Carbonate-Vit D-Min 600-400 MG-UNIT TABS Take 1 tablet by mouth 2 (two) times daily.   Cholecalciferol (VITAMIN D3) 1000 UNITS CAPS Take  1 capsule by mouth daily.   estradiol (ESTRACE) 0.1 MG/GM vaginal cream Use as directed.   fluticasone (FLONASE) 50 MCG/ACT nasal spray Place into the nose.   loratadine (CLARITIN) 10 MG tablet Take by mouth.   magnesium oxide (MAG-OX) 400 (240 Mg) MG tablet Take 1 tablet (400 mg total) by mouth daily.   MAGNESIUM PO Take by mouth.   melatonin 3 MG TABS tablet Take 3 mg by mouth at bedtime.   Multiple Vitamin (MULTIVITAMIN) tablet Take 1 tablet by mouth daily.   ondansetron (ZOFRAN) 8 MG tablet Take by mouth.   pyridoxine (B-6) 100 MG tablet Take 100 mg by mouth daily.   raloxifene (EVISTA) 60 MG tablet Take 1 tablet (60 mg total) by mouth daily.   SENNA PLUS 8.6-50 MG tablet    SUMAtriptan (IMITREX) 50 MG tablet Take 1/2 tablet my mouth as directed as needed.   triamcinolone cream (KENALOG) 0.1 % Apply 1 application topically 2 (two) times daily.   zinc gluconate 50 MG tablet Take 50 mg by mouth daily.   Zoster Vaccine Live, PF, (ZOSTAVAX) 40981 UNT/0.65ML injection    [DISCONTINUED] buPROPion (WELLBUTRIN SR) 150 MG 12 hr tablet Take 1 tablet (150 mg total) by mouth daily. (Patient taking differently: 75 mg.)   [DISCONTINUED] buPROPion (WELLBUTRIN) 75 MG tablet Take 75 mg by mouth daily.   [DISCONTINUED] meloxicam (MOBIC) 7.5 MG tablet Take 1 tablet (7.5 mg total) by mouth daily as needed for pain. With meals (Patient taking differently: Take 7.5 mg by mouth daily as needed for pain. With meals Mon, Wed, Fri)   [DISCONTINUED] omeprazole (PRILOSEC) 20 MG capsule Take 1 capsule (20 mg total) by mouth daily.   buPROPion (WELLBUTRIN) 75 MG tablet Take 1 tablet (75 mg total) by mouth daily.   meloxicam (MOBIC) 7.5 MG tablet Take 1 tablet (7.5 mg total) by mouth daily as needed for pain. With meals   omeprazole (PRILOSEC) 20 MG capsule Take 1 capsule (20 mg total)  by mouth daily.   No facility-administered encounter medications on file as of 10/19/2021.     Lab Results  Component Value Date   WBC 6.2 12/29/2019   HGB 13.2 12/29/2019   HCT 39.6 12/29/2019   PLT 252.0 12/29/2019   GLUCOSE 93 12/29/2019   CHOL 182 12/29/2019   TRIG 138.0 12/29/2019   HDL 58.90 12/29/2019   LDLCALC 95 12/29/2019   ALT 15 12/29/2019   AST 26 12/29/2019   NA 140 12/29/2019   K 4.3 12/29/2019   CL 104 12/29/2019   CREATININE 1.00 12/29/2019   BUN 15 12/29/2019   CO2 32 12/29/2019   TSH 1.53 07/09/2019    MM 3D SCREEN BREAST BILATERAL  Result Date: 01/21/2021 CLINICAL DATA:  Screening. EXAM: DIGITAL SCREENING BILATERAL MAMMOGRAM WITH TOMOSYNTHESIS AND CAD TECHNIQUE: Bilateral screening digital craniocaudal and mediolateral oblique mammograms were obtained. Bilateral screening digital breast tomosynthesis was performed. The images were evaluated with computer-aided detection. COMPARISON:  Previous exam(s). ACR Breast Density Category b: There are scattered areas of fibroglandular density. FINDINGS: There are no findings suspicious for malignancy. IMPRESSION: No mammographic evidence of malignancy. A result letter of this screening mammogram will be mailed directly to the patient. RECOMMENDATION: Screening mammogram in one year. (Code:SM-B-01Y) BI-RADS CATEGORY  1: Negative. Electronically Signed   By: Franki Cabot M.D.   On: 01/21/2021 10:36      Assessment & Plan:   Problem List Items Addressed This Visit     Constipation    Controlling  with fiber and stool softener.       Headache    Has imitrex as outlined.  Only takes a 1/4 if needed.  Start magoxide daily.  Follow.       Relevant Medications   buPROPion (WELLBUTRIN) 75 MG tablet   meloxicam (MOBIC) 7.5 MG tablet   Knee pain    Persistent right knee pain as outlined.  Limiting activity.  Taking meloxicam 3 days per week.  Request ortho referral as outlined.       Relevant Orders   Ambulatory  referral to Orthopedic Surgery   Leg cramps    Stretches. Magnesium helping.  magoxide '400mg'$  q day.  Follow.       Ovarian cancer [facility])    Being followed by oncology.  Currently undergoing treatment.         Stress    On wellbutrin. Decreased herself to '75mg'$  q day.  Doing well on this dose.  Follow.       Other Visit Diagnoses     Encounter for screening mammogram for malignant neoplasm of breast    -  Primary   Relevant Orders   MM 3D SCREEN BREAST BILATERAL        Einar Pheasant, MD

## 2021-10-23 ENCOUNTER — Encounter: Payer: Self-pay | Admitting: Internal Medicine

## 2021-10-23 DIAGNOSIS — R252 Cramp and spasm: Secondary | ICD-10-CM | POA: Insufficient documentation

## 2021-10-23 NOTE — Assessment & Plan Note (Signed)
Stretches. Magnesium helping.  magoxide '400mg'$  q day.  Follow.

## 2021-10-23 NOTE — Assessment & Plan Note (Signed)
Has imitrex as outlined.  Only takes a 1/4 if needed.  Start magoxide daily.  Follow.

## 2021-10-23 NOTE — Assessment & Plan Note (Signed)
On wellbutrin. Decreased herself to '75mg'$  q day.  Doing well on this dose.  Follow.

## 2021-10-23 NOTE — Assessment & Plan Note (Signed)
Persistent right knee pain as outlined.  Limiting activity.  Taking meloxicam 3 days per week.  Request ortho referral as outlined.

## 2021-10-23 NOTE — Assessment & Plan Note (Signed)
Being followed by oncology.  Currently undergoing treatment.

## 2021-10-23 NOTE — Assessment & Plan Note (Signed)
Controlling with fiber and stool softener.

## 2021-10-28 ENCOUNTER — Other Ambulatory Visit: Payer: Self-pay | Admitting: Internal Medicine

## 2021-10-31 MED ORDER — SUMATRIPTAN SUCCINATE 50 MG PO TABS: 25.0000 mg | ORAL_TABLET | ORAL | 0 refills | Status: AC | PRN

## 2021-11-20 ENCOUNTER — Other Ambulatory Visit: Payer: Self-pay | Admitting: Internal Medicine

## 2021-11-27 ENCOUNTER — Telehealth (INDEPENDENT_AMBULATORY_CARE_PROVIDER_SITE_OTHER): Payer: Medicare HMO | Admitting: Family Medicine

## 2021-11-27 ENCOUNTER — Encounter: Payer: Self-pay | Admitting: Family Medicine

## 2021-11-27 VITALS — BP 132/78 | Ht 62.0 in | Wt 111.0 lb

## 2021-11-27 DIAGNOSIS — S61211A Laceration without foreign body of left index finger without damage to nail, initial encounter: Secondary | ICD-10-CM | POA: Diagnosis not present

## 2021-11-27 NOTE — Progress Notes (Signed)
[facility]  Telemedicine Visit  Patient consented to have virtual visit and was identified by name and date of birth. Method of visit: Video  Encounter participants: Patient: Yolanda Perkins - located at home Provider: Carollee Leitz - located at home Others (if applicable): none  Chief Complaint: follow up finger cut  HPI:  Reports cut finger while quilting and finger caught in cutter.  Seen at [facility] 08/17,hemostasis was achieved with silver nitrate sticks and she was given course of antibiotics, now completed. Today she reports that the area has healed. Denies any fevers, drainage, bleeding, opening, erythema or edema. No joint pain and able to move all joints without any pain.   ROS: per HPI  Pertinent PMHx:  None  Exam:  BP 132/78 (BP Location: Left Arm, Patient Position: Sitting, Cuff Size: Normal)   Ht '5\' 2"'$  (1.575 m)   Wt 111 lb (50.3 kg)   LMP 04/19/1999   BMI 20.30 kg/m   Respiratory: Speaking in full sentences, no cough, shortness of breath or increased work of breathing.  Assessment/Plan:  Laceration of left index finger 11 days post laceration.  Completed course of Keflex.  Limited visual given nature of visit.  Finger appears well healed without any erythema, drainage, edema.  Moving all finger joints without difficulty. -Well healed laceration -No need for further antibiotics or dressings -Follow up with PCP as needed -Strict return precautions provided    Time spent during visit with patient: 20 minutes

## 2021-12-04 ENCOUNTER — Encounter: Payer: Self-pay | Admitting: Family Medicine

## 2021-12-04 DIAGNOSIS — S61211A Laceration without foreign body of left index finger without damage to nail, initial encounter: Secondary | ICD-10-CM | POA: Insufficient documentation

## 2021-12-04 NOTE — Assessment & Plan Note (Signed)
11 days post laceration.  Completed course of Keflex.  Limited visual given nature of visit.  Finger appears well healed without any erythema, drainage, edema.  Moving all finger joints without difficulty. -Well healed laceration -No need for further antibiotics or dressings -Follow up with PCP as needed -Strict return precautions provided

## 2021-12-19 ENCOUNTER — Encounter: Payer: Self-pay | Admitting: Internal Medicine

## 2021-12-19 NOTE — Telephone Encounter (Signed)
Please call and see if she can come in 12/21/21 at 7:30 - for blood pressure evaluation and discuss treatment.

## 2021-12-21 ENCOUNTER — Ambulatory Visit (INDEPENDENT_AMBULATORY_CARE_PROVIDER_SITE_OTHER): Payer: Medicare HMO | Admitting: Internal Medicine

## 2021-12-21 ENCOUNTER — Encounter: Payer: Self-pay | Admitting: Internal Medicine

## 2021-12-21 VITALS — BP 132/84 | HR 95 | Temp 97.6°F | Ht 62.0 in | Wt 111.6 lb

## 2021-12-21 DIAGNOSIS — I10 Essential (primary) hypertension: Secondary | ICD-10-CM | POA: Diagnosis not present

## 2021-12-21 DIAGNOSIS — Z23 Encounter for immunization: Secondary | ICD-10-CM | POA: Diagnosis not present

## 2021-12-21 DIAGNOSIS — C569 Malignant neoplasm of unspecified ovary: Secondary | ICD-10-CM

## 2021-12-21 DIAGNOSIS — F439 Reaction to severe stress, unspecified: Secondary | ICD-10-CM | POA: Diagnosis not present

## 2021-12-21 MED ORDER — AMLODIPINE BESYLATE 2.5 MG PO TABS: 2.5000 mg | ORAL_TABLET | Freq: Every day | ORAL | 3 refills | Status: DC

## 2021-12-21 NOTE — Progress Notes (Signed)
Patient ID: Yolanda Perkins, female   DOB: Aug 06, 1950, 71 y.o.   MRN: [id number]   Subjective:    Patient ID: Yolanda Perkins, female    DOB: [birth year], 71 y.o.   MRN: [id number]   Patient here for  Chief Complaint  Patient presents with   Follow-up    Blood pressure evaluation    .   HPI Work in for evaluation - elevated blood pressure.  Has been seeing oncology/Dr Secord - treatment for high grade serous ovarian cancer.  Blood pressures remaining 140-150s/80-90s.  S/p EKG 12/19/21 - per report norma SR.  No chest pain.  Breathing stable.  No increased cough or congestion.  No abdominal pain.  Magnesium, stool softener and fiber - helping bowels move.  Handling stress relatively well.  Has good support.     Past Medical History:  Diagnosis Date   Chicken pox    Depression    Migraines    Urinary tract bacterial infections    Past Surgical History:  Procedure Laterality Date   HERNIA REPAIR  1962   TONSILLECTOMY AND ADENOIDECTOMY  1954   Family History  Problem Relation Age of Onset   Arthritis Mother        rheumatoid -which is in remission   Osteoporosis Mother    Transient ischemic attack Mother    Congestive Heart Failure Mother    Diverticulitis Mother    Ovarian cancer Mother    Macular degeneration Mother    Heart disease Father        s/p bypass surgery   Macular degeneration Father    Breast cancer Maternal Aunt    Uterine cancer Maternal Aunt    Uterine cancer Maternal Grandmother    Breast cancer Maternal Aunt    Breast cancer Cousin 62   Social History   Socioeconomic History   Marital status: Married    Spouse name: Not on file   Number of children: Not on file   Years of education: Not on file   Highest education level: Not on file  Occupational History   Not on file  Tobacco Use   Smoking status: Never   Smokeless tobacco: Never  Substance and Sexual Activity   Alcohol use: No    Alcohol/week: 0.0 standard drinks of alcohol   Drug use: No    Sexual activity: Not on file  Other Topics Concern   Not on file  Social History Narrative   She is married and has 2 daughters.   Social Determinants of Health   Financial Resource Strain: Low Risk  (09/18/2021)   Overall Financial Resource Strain (CARDIA)    Difficulty of Paying Living Expenses: Not hard at all  Food Insecurity: No Food Insecurity (09/18/2021)   Hunger Vital Sign    Worried About Running Out of Food in the Last Year: Never true    Ran Out of Food in the Last Year: Never true  Transportation Needs: No Transportation Needs (09/18/2021)   PRAPARE - Hydrologist (Medical): No    Lack of Transportation (Non-Medical): No  Physical Activity: Sufficiently Active (09/18/2021)   Exercise Vital Sign    Days of Exercise per Week: 5 days    Minutes of Exercise per Session: 30 min  Stress: No Stress Concern Present (09/18/2021)   [place]    Feeling of Stress : Not at all  Social Connections: Unknown (09/18/2021)   Social Connection and Isolation  Panel [NHANES]    Frequency of Communication with Friends and Family: Not on file    Frequency of Social Gatherings with Friends and Family: Not on file    Attends Religious Services: Not on file    Active Member of Clubs or Organizations: Not on file    Attends Archivist Meetings: Not on file    Marital Status: Married     Review of Systems  Constitutional:  Negative for appetite change and unexpected weight change.  HENT:  Negative for congestion and sinus pressure.   Respiratory:  Negative for cough, chest tightness and shortness of breath.   Cardiovascular:  Negative for chest pain, palpitations and leg swelling.  Gastrointestinal:  Negative for abdominal pain, diarrhea, nausea and vomiting.  Genitourinary:  Negative for difficulty urinating and dysuria.  Musculoskeletal:  Negative for joint swelling and myalgias.  Skin:   Negative for color change and rash.  Neurological:  Negative for dizziness, light-headedness and headaches.  Psychiatric/Behavioral:  Negative for agitation and dysphoric mood.        Objective:     BP 132/84 (BP Location: Left Arm, Patient Position: Sitting, Cuff Size: Normal)   Pulse 95   Temp 97.6 F (36.4 C) (Oral)   Ht '5\' 2"'$  (1.575 m)   Wt 111 lb 9.6 oz (50.6 kg)   LMP 04/19/1999   SpO2 98%   BMI 20.41 kg/m  Wt Readings from Last 3 Encounters:  12/21/21 111 lb 9.6 oz (50.6 kg)  11/27/21 111 lb (50.3 kg)  10/19/21 109 lb (49.4 kg)    Physical Exam Vitals reviewed.  Constitutional:      General: She is not in acute distress.    Appearance: Normal appearance.  HENT:     Head: Normocephalic and atraumatic.     Right Ear: External ear normal.     Left Ear: External ear normal.  Eyes:     General: No scleral icterus.       Right eye: No discharge.        Left eye: No discharge.     Conjunctiva/sclera: Conjunctivae normal.  Neck:     Thyroid: No thyromegaly.  Cardiovascular:     Rate and Rhythm: Normal rate and regular rhythm.  Pulmonary:     Effort: No respiratory distress.     Breath sounds: Normal breath sounds. No wheezing.  Abdominal:     General: Bowel sounds are normal.     Palpations: Abdomen is soft.     Tenderness: There is no abdominal tenderness.  Musculoskeletal:        General: No swelling or tenderness.     Cervical back: Neck supple. No tenderness.  Lymphadenopathy:     Cervical: No cervical adenopathy.  Skin:    Findings: No erythema or rash.  Neurological:     Mental Status: She is alert.  Psychiatric:        Mood and Affect: Mood normal.        Behavior: Behavior normal.      Outpatient Encounter Medications as of 12/21/2021  Medication Sig   amLODipine (NORVASC) 2.5 MG tablet Take 1 tablet (2.5 mg total) by mouth daily.   buPROPion (WELLBUTRIN) 75 MG tablet Take 1 tablet (75 mg total) by mouth daily.   Calcium Carbonate-Vit D-Min  600-400 MG-UNIT TABS Take 1 tablet by mouth 2 (two) times daily.   Cholecalciferol (VITAMIN D3) 1000 UNITS CAPS Take 1 capsule by mouth daily.   fluticasone (FLONASE) 50 MCG/ACT nasal spray Place  into the nose.   loratadine (CLARITIN) 10 MG tablet Take by mouth.   magnesium oxide (MAG-OX) 400 (240 Mg) MG tablet Take 1 tablet (400 mg total) by mouth daily.   MAGNESIUM PO Take by mouth.   melatonin 3 MG TABS tablet Take 3 mg by mouth at bedtime.   meloxicam (MOBIC) 7.5 MG tablet Take 1 tablet (7.5 mg total) by mouth daily as needed for pain. With meals   Multiple Vitamin (MULTIVITAMIN) tablet Take 1 tablet by mouth daily.   omeprazole (PRILOSEC) 20 MG capsule Take 1 capsule (20 mg total) by mouth daily.   ondansetron (ZOFRAN) 8 MG tablet Take by mouth.   pyridoxine (B-6) 100 MG tablet Take 100 mg by mouth daily.   raloxifene (EVISTA) 60 MG tablet Take 1 tablet (60 mg total) by mouth daily.   SENNA PLUS 8.6-50 MG tablet    SUMAtriptan (IMITREX) 50 MG tablet Take 0.5 tablets (25 mg total) by mouth as needed for migraine. May repeat in 2 hours if headache persists or recurs.   triamcinolone cream (KENALOG) 0.1 % Apply 1 application topically 2 (two) times daily.   zinc gluconate 50 MG tablet Take 50 mg by mouth daily.   Zoster Vaccine Live, PF, (ZOSTAVAX) [id number] UNT/0.65ML injection    [DISCONTINUED] estradiol (ESTRACE) 0.1 MG/GM vaginal cream Use as directed.   No facility-administered encounter medications on file as of 12/21/2021.     Lab Results  Component Value Date   WBC 6.2 12/29/2019   HGB 13.2 12/29/2019   HCT 39.6 12/29/2019   PLT 252.0 12/29/2019   GLUCOSE 93 12/29/2019   CHOL 182 12/29/2019   TRIG 138.0 12/29/2019   HDL 58.90 12/29/2019   LDLCALC 95 12/29/2019   ALT 15 12/29/2019   AST 26 12/29/2019   NA 140 12/29/2019   K 4.3 12/29/2019   CL 104 12/29/2019   CREATININE 1.00 12/29/2019   BUN 15 12/29/2019   CO2 32 12/29/2019   TSH 1.53 07/09/2019    MM 3D SCREEN  BREAST BILATERAL  Result Date: 01/21/2021 CLINICAL DATA:  Screening. EXAM: DIGITAL SCREENING BILATERAL MAMMOGRAM WITH TOMOSYNTHESIS AND CAD TECHNIQUE: Bilateral screening digital craniocaudal and mediolateral oblique mammograms were obtained. Bilateral screening digital breast tomosynthesis was performed. The images were evaluated with computer-aided detection. COMPARISON:  Previous exam(s). ACR Breast Density Category b: There are scattered areas of fibroglandular density. FINDINGS: There are no findings suspicious for malignancy. IMPRESSION: No mammographic evidence of malignancy. A result letter of this screening mammogram will be mailed directly to the patient. RECOMMENDATION: Screening mammogram in one year. (Code:SM-B-01Y) BI-RADS CATEGORY  1: Negative. Electronically Signed   By: Franki Cabot M.D.   On: 01/21/2021 10:36      Assessment & Plan:   Problem List Items Addressed This Visit     Hypertension    Discussed elevated blood pressure.  Start amlodipine 2.'5mg'$  q day.  Follow pressures.  Follow metabolic panel.       Relevant Medications   amLODipine (NORVASC) 2.5 MG tablet   Ovarian cancer [facility])    Being followed by oncology.  Currently undergoing treatment.         Stress    On wellbutrin.  Follow.  Has good support.       Other Visit Diagnoses     Need for immunization against influenza    -  Primary   Relevant Orders   Flu Vaccine QUAD High Dose(Fluad) (Completed)        Einar Pheasant, MD

## 2021-12-26 ENCOUNTER — Telehealth: Payer: Self-pay | Admitting: Internal Medicine

## 2021-12-26 NOTE — Telephone Encounter (Signed)
Patty form [facility] called stating the provider Fredric Dine) is increasing the pt BP medication from 2.'5mg'$  to '5mg'$ 

## 2021-12-27 NOTE — Telephone Encounter (Signed)
Noted.  Have her spot check her pressures.  Can send in readings.

## 2021-12-27 NOTE — Telephone Encounter (Signed)
Pt stated that she would spot check BP's & let us know what they are as she is also doing for Dr. Theora Gianotti.

## 2021-12-28 ENCOUNTER — Encounter: Payer: Self-pay | Admitting: Internal Medicine

## 2021-12-29 NOTE — Telephone Encounter (Signed)
I called and spoke with the patient and confirmed that she is okay and she is patient stated she will continue to take her BP and will be evaluated if it is more elevated.  Brodin Gelpi,cma

## 2021-12-29 NOTE — Telephone Encounter (Signed)
Please call Yolanda Perkins.  Reviewed blood pressures and record.  She saw Dr Theora Gianotti on 12/26/21 and she increased her amlodipine to '5mg'$  q day.  These readings appear to be before this change and then immediately after the visit.  Since the blood pressure medication was just changed, confirm doing ok.  Have her continue to check her pressure.  Make sure has a f/u appt.  If any acute change or persistent elevation, needs to be evaluated.

## 2021-12-30 DIAGNOSIS — I10 Essential (primary) hypertension: Secondary | ICD-10-CM | POA: Insufficient documentation

## 2021-12-30 NOTE — Assessment & Plan Note (Signed)
On wellbutrin.  Follow.  Has good support.

## 2021-12-30 NOTE — Assessment & Plan Note (Signed)
Being followed by oncology.  Currently undergoing treatment.

## 2021-12-30 NOTE — Assessment & Plan Note (Signed)
Discussed elevated blood pressure.  Start amlodipine 2.'5mg'$  q day.  Follow pressures.  Follow metabolic panel.

## 2022-01-02 ENCOUNTER — Encounter: Payer: Self-pay | Admitting: Internal Medicine

## 2022-01-03 ENCOUNTER — Other Ambulatory Visit: Payer: Self-pay

## 2022-01-03 MED ORDER — AMLODIPINE BESYLATE 5 MG PO TABS: 5.0000 mg | ORAL_TABLET | Freq: Every day | ORAL | 3 refills | Status: DC

## 2022-01-26 ENCOUNTER — Telehealth: Payer: Self-pay

## 2022-01-26 ENCOUNTER — Ambulatory Visit: Payer: Medicare HMO | Admitting: Internal Medicine

## 2022-01-26 ENCOUNTER — Encounter: Payer: Self-pay | Admitting: Internal Medicine

## 2022-01-26 VITALS — BP 122/84 | HR 83 | Temp 98.2°F | Resp 17 | Ht 62.0 in | Wt 112.2 lb

## 2022-01-26 DIAGNOSIS — I10 Essential (primary) hypertension: Secondary | ICD-10-CM

## 2022-01-26 DIAGNOSIS — C569 Malignant neoplasm of unspecified ovary: Secondary | ICD-10-CM

## 2022-01-26 DIAGNOSIS — F439 Reaction to severe stress, unspecified: Secondary | ICD-10-CM

## 2022-01-26 DIAGNOSIS — R7989 Other specified abnormal findings of blood chemistry: Secondary | ICD-10-CM | POA: Diagnosis not present

## 2022-01-26 DIAGNOSIS — Z13220 Encounter for screening for lipoid disorders: Secondary | ICD-10-CM

## 2022-01-26 LAB — LIPID PANEL
Cholesterol: 195 mg/dL (ref 0–200)
HDL: 54.9 mg/dL (ref >39.00)
LDL Cholesterol: 115 mg/dL — ABNORMAL HIGH (ref 0–99)
NonHDL: 139.8
Total CHOL/HDL Ratio: 4
Triglycerides: 125 mg/dL (ref 0.0–149.0)
VLDL: 25 mg/dL (ref 0.0–40.0)

## 2022-01-26 MED ORDER — AMLODIPINE BESYLATE 5 MG PO TABS: 5.0000 mg | ORAL_TABLET | Freq: Every day | ORAL | 1 refills | Status: DC

## 2022-01-26 MED ORDER — BUPROPION HCL 75 MG PO TABS: 75.0000 mg | ORAL_TABLET | Freq: Every day | ORAL | 1 refills | Status: DC

## 2022-01-26 NOTE — Telephone Encounter (Signed)
Check-out instructions for Dr. Einar Pheasant would like to see patient back in 10 weeks for physical.  Patient has been scheduled for 04/27/2022, which is the next available spot for physical.

## 2022-01-26 NOTE — Progress Notes (Unsigned)
Patient ID: Yolanda Perkins, female   DOB: [birth year], 71 y.o.   MRN: [id number]   Subjective:    Patient ID: Yolanda Perkins, female    DOB: 1951-03-29, 71 y.o.   MRN: [id number]   Patient here for  Chief Complaint  Patient presents with   Follow-up    4 wk f/u of blood pressure   .   HPI Here to follow up regarding her blood pressure. Saw oncology 01/23/22 - f/u ovarian cancer.  Evaluated by hepatology - w/up for liver injury.  Scheduled for liver ultrasound.    Past Medical History:  Diagnosis Date   Chicken pox    Depression    Migraines    Urinary tract bacterial infections    Past Surgical History:  Procedure Laterality Date   HERNIA REPAIR  1962   TONSILLECTOMY AND ADENOIDECTOMY  1954   Family History  Problem Relation Age of Onset   Arthritis Mother        rheumatoid -which is in remission   Osteoporosis Mother    Transient ischemic attack Mother    Congestive Heart Failure Mother    Diverticulitis Mother    Ovarian cancer Mother    Macular degeneration Mother    Heart disease Father        s/p bypass surgery   Macular degeneration Father    Breast cancer Maternal Aunt    Uterine cancer Maternal Aunt    Uterine cancer Maternal Grandmother    Breast cancer Maternal Aunt    Breast cancer Cousin 62   Social History   Socioeconomic History   Marital status: Married    Spouse name: Not on file   Number of children: Not on file   Years of education: Not on file   Highest education level: Not on file  Occupational History   Not on file  Tobacco Use   Smoking status: Never   Smokeless tobacco: Never  Substance and Sexual Activity   Alcohol use: No    Alcohol/week: 0.0 standard drinks of alcohol   Drug use: No   Sexual activity: Not on file  Other Topics Concern   Not on file  Social History Narrative   She is married and has 2 daughters.   Social Determinants of Health   Financial Resource Strain: Low Risk  (09/18/2021)   Overall Financial Resource  Strain (CARDIA)    Difficulty of Paying Living Expenses: Not hard at all  Food Insecurity: No Food Insecurity (09/18/2021)   Hunger Vital Sign    Worried About Running Out of Food in the Last Year: Never true    Ran Out of Food in the Last Year: Never true  Transportation Needs: No Transportation Needs (09/18/2021)   PRAPARE - Hydrologist (Medical): No    Lack of Transportation (Non-Medical): No  Physical Activity: Sufficiently Active (09/18/2021)   Exercise Vital Sign    Days of Exercise per Week: 5 days    Minutes of Exercise per Session: 30 min  Stress: No Stress Concern Present (09/18/2021)   [place]    Feeling of Stress : Not at all  Social Connections: Unknown (09/18/2021)   Social Connection and Isolation Panel [NHANES]    Frequency of Communication with Friends and Family: Not on file    Frequency of Social Gatherings with Friends and Family: Not on file    Attends Religious Services: Not on file    Active  Member of Clubs or Organizations: Not on file    Attends Archivist Meetings: Not on file    Marital Status: Married     Review of Systems     Objective:     BP 122/84 (BP Location: Left Arm, Patient Position: Sitting, Cuff Size: Normal)   Pulse 83   Temp 98.2 F (36.8 C) (Oral)   Resp 17   Ht '5\' 2"'$  (1.575 m)   Wt 112 lb 4 oz (50.9 kg)   LMP 04/19/1999   SpO2 98%   BMI 20.53 kg/m  Wt Readings from Last 3 Encounters:  01/26/22 112 lb 4 oz (50.9 kg)  12/21/21 111 lb 9.6 oz (50.6 kg)  11/27/21 111 lb (50.3 kg)    Physical Exam   Outpatient Encounter Medications as of 01/26/2022  Medication Sig   amLODipine (NORVASC) 5 MG tablet Take 1 tablet (5 mg total) by mouth daily.   buPROPion (WELLBUTRIN) 75 MG tablet Take 1 tablet (75 mg total) by mouth daily.   Calcium Carbonate-Vit D-Min 600-400 MG-UNIT TABS Take 1 tablet by mouth 2 (two) times daily.    Cholecalciferol (VITAMIN D3) 1000 UNITS CAPS Take 1 capsule by mouth daily.   fluticasone (FLONASE) 50 MCG/ACT nasal spray Place into the nose.   loratadine (CLARITIN) 10 MG tablet Take by mouth.   magnesium oxide (MAG-OX) 400 (240 Mg) MG tablet Take 1 tablet (400 mg total) by mouth daily.   MAGNESIUM PO Take by mouth.   melatonin 3 MG TABS tablet Take 3 mg by mouth at bedtime.   meloxicam (MOBIC) 7.5 MG tablet Take 1 tablet (7.5 mg total) by mouth daily as needed for pain. With meals   Multiple Vitamin (MULTIVITAMIN) tablet Take 1 tablet by mouth daily.   omeprazole (PRILOSEC) 20 MG capsule Take 1 capsule (20 mg total) by mouth daily.   ondansetron (ZOFRAN) 8 MG tablet Take by mouth.   pyridoxine (B-6) 100 MG tablet Take 100 mg by mouth daily.   raloxifene (EVISTA) 60 MG tablet Take 1 tablet (60 mg total) by mouth daily.   SENNA PLUS 8.6-50 MG tablet    SUMAtriptan (IMITREX) 50 MG tablet Take 0.5 tablets (25 mg total) by mouth as needed for migraine. May repeat in 2 hours if headache persists or recurs.   triamcinolone cream (KENALOG) 0.1 % Apply 1 application topically 2 (two) times daily.   zinc gluconate 50 MG tablet Take 50 mg by mouth daily.   Zoster Vaccine Live, PF, (ZOSTAVAX) 14431 UNT/0.65ML injection    No facility-administered encounter medications on file as of 01/26/2022.     Lab Results  Component Value Date   WBC 6.2 12/29/2019   HGB 13.2 12/29/2019   HCT 39.6 12/29/2019   PLT 252.0 12/29/2019   GLUCOSE 93 12/29/2019   CHOL 182 12/29/2019   TRIG 138.0 12/29/2019   HDL 58.90 12/29/2019   LDLCALC 95 12/29/2019   ALT 15 12/29/2019   AST 26 12/29/2019   NA 140 12/29/2019   K 4.3 12/29/2019   CL 104 12/29/2019   CREATININE 1.00 12/29/2019   BUN 15 12/29/2019   CO2 32 12/29/2019   TSH 1.53 07/09/2019    MM 3D SCREEN BREAST BILATERAL  Result Date: 01/21/2021 CLINICAL DATA:  Screening. EXAM: DIGITAL SCREENING BILATERAL MAMMOGRAM WITH TOMOSYNTHESIS AND CAD  TECHNIQUE: Bilateral screening digital craniocaudal and mediolateral oblique mammograms were obtained. Bilateral screening digital breast tomosynthesis was performed. The images were evaluated with computer-aided detection. COMPARISON:  Previous exam(s).  ACR Breast Density Category b: There are scattered areas of fibroglandular density. FINDINGS: There are no findings suspicious for malignancy. IMPRESSION: No mammographic evidence of malignancy. A result letter of this screening mammogram will be mailed directly to the patient. RECOMMENDATION: Screening mammogram in one year. (Code:SM-B-01Y) BI-RADS CATEGORY  1: Negative. Electronically Signed   By: Franki Cabot M.D.   On: 01/21/2021 10:36      Assessment & Plan:   Problem List Items Addressed This Visit   None    Einar Pheasant, MD

## 2022-01-27 ENCOUNTER — Encounter: Payer: Self-pay | Admitting: Internal Medicine

## 2022-01-27 NOTE — Assessment & Plan Note (Signed)
Blood pressure doing better overall.  Continue amlodipine as she is doing.  Continue to follow pressures.  Follow metabolic panel.

## 2022-01-27 NOTE — Assessment & Plan Note (Signed)
On wellbutrin. Doing well on this dose.  Has good support.  Follow.

## 2022-01-27 NOTE — Assessment & Plan Note (Signed)
Being followed by oncology.  Currently undergoing treatment.   On hold temporarily - for further evaluation of liver.  F/u planned next week.  If liver ok - restart treatment.

## 2022-01-27 NOTE — Assessment & Plan Note (Signed)
Have previously discussed cholesterol. Recheck today.

## 2022-02-16 ENCOUNTER — Ambulatory Visit
Admission: RE | Admit: 2022-02-16 | Discharge: 2022-02-16 | Disposition: A | Discharge status: Home / Self Care | Payer: Medicare HMO | Source: Ambulatory Visit | Attending: Internal Medicine | Admitting: Internal Medicine

## 2022-02-16 DIAGNOSIS — Z1231 Encounter for screening mammogram for malignant neoplasm of breast: Secondary | ICD-10-CM | POA: Insufficient documentation

## 2022-02-21 ENCOUNTER — Encounter: Payer: Medicare HMO | Admitting: Internal Medicine

## 2022-03-02 IMAGING — DX DG ABDOMEN 1V
1 series · 1 of 1 positions shown · non-contrast
Comparison: None.

CLINICAL DATA: Abdominal pain and bloating

EXAM:
ABDOMEN - 1 VIEW

[abdomen standing ap]
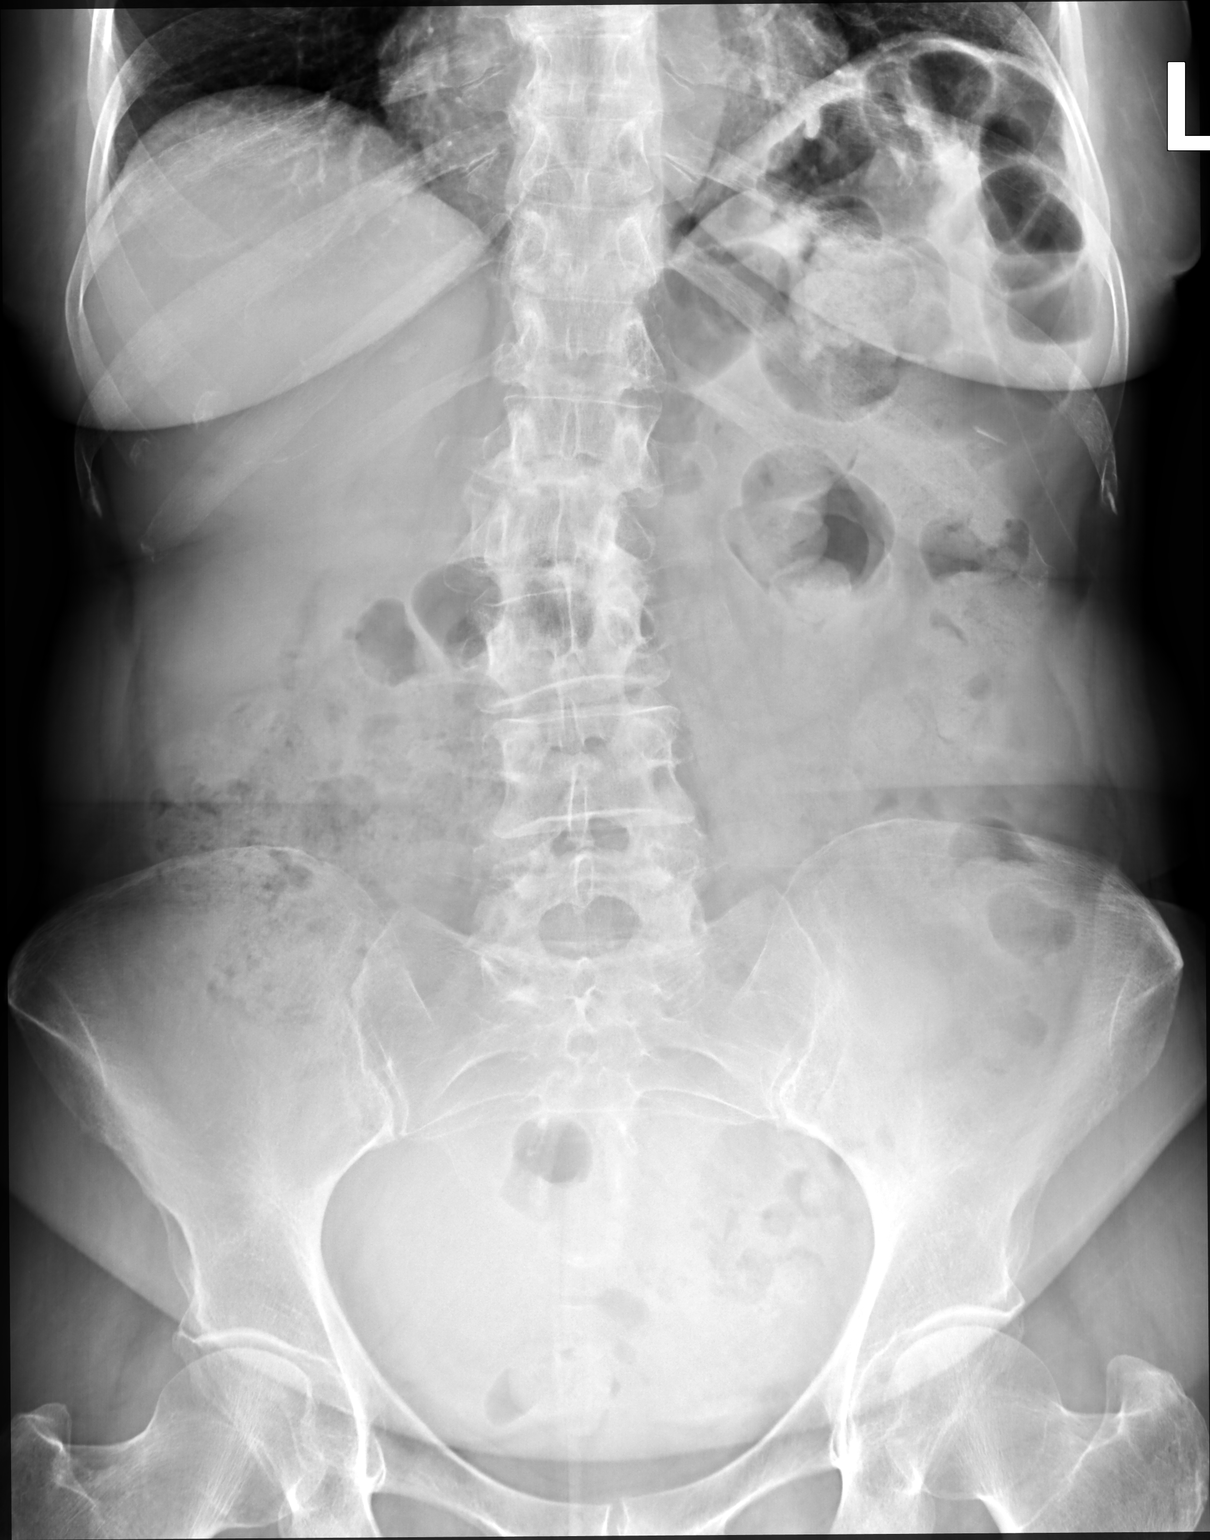

[1 of 1 positions shown; findings below may reference images not displayed]

FINDINGS: Scattered large and small bowel gas is noted. Retained fecal
material is noted consistent with a mild degree of constipation. No
free air is noted. No abnormal mass or abnormal calcifications are
seen. Degenerative changes of the lumbar spine are noted.
IMPRESSION: Mild constipation.  No other focal abnormality is noted.

## 2022-03-15 ENCOUNTER — Other Ambulatory Visit: Payer: Self-pay | Admitting: Internal Medicine

## 2022-03-16 ENCOUNTER — Other Ambulatory Visit: Payer: Self-pay | Admitting: Internal Medicine

## 2022-04-27 ENCOUNTER — Ambulatory Visit (INDEPENDENT_AMBULATORY_CARE_PROVIDER_SITE_OTHER): Payer: Medicare HMO | Admitting: Internal Medicine

## 2022-04-27 ENCOUNTER — Encounter: Payer: Self-pay | Admitting: Internal Medicine

## 2022-04-27 VITALS — BP 126/70 | HR 78 | Temp 97.6°F | Resp 16 | Ht 61.0 in | Wt 115.4 lb

## 2022-04-27 DIAGNOSIS — I10 Essential (primary) hypertension: Secondary | ICD-10-CM | POA: Diagnosis not present

## 2022-04-27 DIAGNOSIS — C569 Malignant neoplasm of unspecified ovary: Secondary | ICD-10-CM

## 2022-04-27 DIAGNOSIS — K5900 Constipation, unspecified: Secondary | ICD-10-CM | POA: Diagnosis not present

## 2022-04-27 DIAGNOSIS — Z0000 Encounter for general adult medical examination without abnormal findings: Secondary | ICD-10-CM

## 2022-04-27 DIAGNOSIS — M25561 Pain in right knee: Secondary | ICD-10-CM | POA: Diagnosis not present

## 2022-04-27 DIAGNOSIS — M25562 Pain in left knee: Secondary | ICD-10-CM

## 2022-04-27 DIAGNOSIS — F439 Reaction to severe stress, unspecified: Secondary | ICD-10-CM

## 2022-04-27 NOTE — Progress Notes (Unsigned)
Subjective:    Patient ID: Yolanda Perkins, female    DOB: 21-Apr-1950, 72 y.o.   MRN: [id number]  Patient here for  Chief Complaint  Patient presents with   Annual Exam    HPI Here for physical exam. Being followed by oncology - treatment of ovarian cancer.  Evaluated 04/24/22 - Dr Theora Gianotti. Due f/u CT scan in February.  Doing well.  Last scan stable. Overall handling stress relatively well.  Has good support.  Saw ortho 04/25/22 - right knee pain.  S/p injection.  Helping. Stays active.  Walking dog.  Riding recumbent bike. No chest pain or sob reported.  No increased cough or congestion. Taking senna - keeps bowels moving.     Past Medical History:  Diagnosis Date   Chicken pox    Depression    Migraines    Urinary tract bacterial infections    Past Surgical History:  Procedure Laterality Date   HERNIA REPAIR  1962   TONSILLECTOMY AND ADENOIDECTOMY  1954   Family History  Problem Relation Age of Onset   Arthritis Mother        rheumatoid -which is in remission   Osteoporosis Mother    Transient ischemic attack Mother    Congestive Heart Failure Mother    Diverticulitis Mother    Ovarian cancer Mother    Macular degeneration Mother    Heart disease Father        s/p bypass surgery   Macular degeneration Father    Breast cancer Maternal Aunt    Uterine cancer Maternal Aunt    Uterine cancer Maternal Grandmother    Breast cancer Maternal Aunt    Breast cancer Cousin 62   Social History   Socioeconomic History   Marital status: Married    Spouse name: Not on file   Number of children: Not on file   Years of education: Not on file   Highest education level: Not on file  Occupational History   Not on file  Tobacco Use   Smoking status: Never   Smokeless tobacco: Never  Substance and Sexual Activity   Alcohol use: No    Alcohol/week: 0.0 standard drinks of alcohol   Drug use: No   Sexual activity: Not on file  Other Topics Concern   Not on file  Social History  Narrative   She is married and has 2 daughters.   Social Determinants of Health   Financial Resource Strain: Low Risk  (09/18/2021)   Overall Financial Resource Strain (CARDIA)    Difficulty of Paying Living Expenses: Not hard at all  Food Insecurity: No Food Insecurity (09/18/2021)   Hunger Vital Sign    Worried About Running Out of Food in the Last Year: Never true    Ran Out of Food in the Last Year: Never true  Transportation Needs: No Transportation Needs (09/18/2021)   PRAPARE - Hydrologist (Medical): No    Lack of Transportation (Non-Medical): No  Physical Activity: Sufficiently Active (09/18/2021)   Exercise Vital Sign    Days of Exercise per Week: 5 days    Minutes of Exercise per Session: 30 min  Stress: No Stress Concern Present (09/18/2021)   [place]    Feeling of Stress : Not at all  Social Connections: Unknown (09/18/2021)   Social Connection and Isolation Panel [NHANES]    Frequency of Communication with Friends and Family: Not on file  Frequency of Social Gatherings with Friends and Family: Not on file    Attends Religious Services: Not on file    Active Member of Clubs or Organizations: Not on file    Attends Archivist Meetings: Not on file    Marital Status: Married     Review of Systems  Constitutional:  Negative for appetite change and unexpected weight change.  HENT:  Negative for congestion, sinus pressure and sore throat.   Eyes:  Negative for pain and visual disturbance.  Respiratory:  Negative for cough, chest tightness and shortness of breath.   Cardiovascular:  Negative for chest pain, palpitations and leg swelling.  Gastrointestinal:  Negative for abdominal pain, constipation and diarrhea.  Genitourinary:  Negative for difficulty urinating and dysuria.  Musculoskeletal:  Negative for back pain and joint swelling.  Skin:  Negative for color  change and rash.  Neurological:  Negative for dizziness and headaches.  Hematological:  Negative for adenopathy. Does not bruise/bleed easily.  Psychiatric/Behavioral:  Negative for decreased concentration and dysphoric mood.        Objective:     BP 126/70   Pulse 78   Temp 97.6 F (36.4 C)   Resp 16   Ht '5\' 1"'$  (1.549 m)   Wt 115 lb 6.4 oz (52.3 kg)   LMP 04/19/1999   SpO2 97%   BMI 21.80 kg/m  Wt Readings from Last 3 Encounters:  04/27/22 115 lb 6.4 oz (52.3 kg)  01/26/22 112 lb 4 oz (50.9 kg)  12/21/21 111 lb 9.6 oz (50.6 kg)    Physical Exam Vitals reviewed.  Constitutional:      General: She is not in acute distress.    Appearance: Normal appearance. She is well-developed.  HENT:     Head: Normocephalic and atraumatic.     Right Ear: External ear normal.     Left Ear: External ear normal.  Eyes:     General: No scleral icterus.       Right eye: No discharge.        Left eye: No discharge.     Conjunctiva/sclera: Conjunctivae normal.  Neck:     Thyroid: No thyromegaly.  Cardiovascular:     Rate and Rhythm: Normal rate and regular rhythm.  Pulmonary:     Effort: No tachypnea, accessory muscle usage or respiratory distress.     Breath sounds: Normal breath sounds. No decreased breath sounds or wheezing.  Chest:  Breasts:    Right: No inverted nipple, mass, nipple discharge or tenderness (no axillary adenopathy).     Left: No inverted nipple, mass, nipple discharge or tenderness (no axilarry adenopathy).  Abdominal:     General: Bowel sounds are normal.     Palpations: Abdomen is soft.     Tenderness: There is no abdominal tenderness.  Musculoskeletal:        General: No swelling or tenderness.     Cervical back: Neck supple.  Lymphadenopathy:     Cervical: No cervical adenopathy.  Skin:    Findings: No erythema or rash.  Neurological:     Mental Status: She is alert and oriented to person, place, and time.  Psychiatric:        Mood and Affect: Mood  normal.        Behavior: Behavior normal.      Outpatient Encounter Medications as of 04/27/2022  Medication Sig   UNABLE TO FIND Med Name: ZnC3   amLODipine (NORVASC) 5 MG tablet Take 1 tablet (5 mg  total) by mouth daily.   buPROPion (WELLBUTRIN) 75 MG tablet Take 1 tablet (75 mg total) by mouth daily.   Calcium Carbonate-Vit D-Min 600-400 MG-UNIT TABS Take 1 tablet by mouth 2 (two) times daily.   Cholecalciferol (VITAMIN D3) 1000 UNITS CAPS Take 1 capsule by mouth daily.   fluticasone (FLONASE) 50 MCG/ACT nasal spray Place into the nose.   loratadine (CLARITIN) 10 MG tablet Take by mouth.   magnesium oxide (MAG-OX) 400 (240 Mg) MG tablet Take 1 tablet (400 mg total) by mouth daily.   MAGNESIUM PO Take by mouth.   melatonin 3 MG TABS tablet Take 3 mg by mouth at bedtime.   meloxicam (MOBIC) 7.5 MG tablet Take 1 tablet (7.5 mg total) by mouth daily as needed for pain. With meals   Multiple Vitamin (MULTIVITAMIN) tablet Take 1 tablet by mouth daily.   omeprazole (PRILOSEC) 20 MG capsule Take 1 capsule (20 mg total) by mouth daily.   ondansetron (ZOFRAN) 8 MG tablet Take by mouth.   pyridoxine (B-6) 100 MG tablet Take 100 mg by mouth daily.   raloxifene (EVISTA) 60 MG tablet Take 1 tablet (60 mg total) by mouth daily.   SENNA PLUS 8.6-50 MG tablet    SUMAtriptan (IMITREX) 50 MG tablet Take 0.5 tablets (25 mg total) by mouth as needed for migraine. May repeat in 2 hours if headache persists or recurs.   triamcinolone cream (KENALOG) 0.1 % Apply 1 application topically 2 (two) times daily.   zinc gluconate 50 MG tablet Take 50 mg by mouth daily.   [DISCONTINUED] Zoster Vaccine Live, PF, (ZOSTAVAX) 76734 UNT/0.65ML injection    No facility-administered encounter medications on file as of 04/27/2022.     Lab Results  Component Value Date   WBC 6.2 12/29/2019   HGB 13.2 12/29/2019   HCT 39.6 12/29/2019   PLT 252.0 12/29/2019   GLUCOSE 93 12/29/2019   CHOL 195 01/26/2022   TRIG 125.0  01/26/2022   HDL 54.90 01/26/2022   LDLCALC 115 (H) 01/26/2022   ALT 15 12/29/2019   AST 26 12/29/2019   NA 140 12/29/2019   K 4.3 12/29/2019   CL 104 12/29/2019   CREATININE 1.00 12/29/2019   BUN 15 12/29/2019   CO2 32 12/29/2019   TSH 1.53 07/09/2019    MM 3D SCREEN BREAST BILATERAL  Result Date: 02/20/2022 CLINICAL DATA:  Screening. EXAM: DIGITAL SCREENING BILATERAL MAMMOGRAM WITH TOMOSYNTHESIS AND CAD TECHNIQUE: Bilateral screening digital craniocaudal and mediolateral oblique mammograms were obtained. Bilateral screening digital breast tomosynthesis was performed. The images were evaluated with computer-aided detection. COMPARISON:  Previous exam(s). ACR Breast Density Category c: The breast tissue is heterogeneously dense, which may obscure small masses. FINDINGS: There are no findings suspicious for malignancy. IMPRESSION: No mammographic evidence of malignancy. A result letter of this screening mammogram will be mailed directly to the patient. RECOMMENDATION: Screening mammogram in one year. (Code:SM-B-01Y) BI-RADS CATEGORY  1: Negative. Electronically Signed   By: Lovey Newcomer M.D.   On: 02/20/2022 06:04       Assessment & Plan:  Routine general medical examination at a health care facility  Health care maintenance Assessment & Plan: Physical today 04/27/22.  Mammogram 02/16/22 - Birads I.  Colonoscopy 72016 - 10 year f/u.    Hypertension, unspecified type Assessment & Plan: Blood pressure doing better overall.  Continue amlodipine as she is doing.  Continue to follow pressures.  Follow metabolic panel.    Constipation, unspecified constipation type Assessment & Plan: Senna -  helping.  Follow.    Pain in both knees, unspecified chronicity Assessment & Plan: Saw ortho 04/25/22 - right knee pain.  S/p injection.  Helping. Stays active.  Walking dog.  Riding recumbent bike.    Malignant neoplasm of ovary, unspecified laterality [facility]) Assessment & Plan: Being followed  by oncology - treatment of ovarian cancer.  Evaluated 04/24/22 - Dr Theora Gianotti. Due f/u CT scan in February.  Doing well.  Last scan stable.    Stress Assessment & Plan: On wellbutrin. Doing well on this dose.  Has good support.  Follow.       Einar Pheasant, MD

## 2022-04-27 NOTE — Assessment & Plan Note (Signed)
Physical today 04/27/22.  Mammogram 02/16/22 - Birads I.  Colonoscopy 72016 - 10 year f/u.

## 2022-04-28 ENCOUNTER — Encounter: Payer: Self-pay | Admitting: Internal Medicine

## 2022-04-28 NOTE — Assessment & Plan Note (Signed)
Being followed by oncology - treatment of ovarian cancer.  Evaluated 04/24/22 - Dr Theora Gianotti. Due f/u CT scan in February.  Doing well.  Last scan stable.

## 2022-04-28 NOTE — Assessment & Plan Note (Signed)
On wellbutrin. Doing well on this dose.  Has good support.  Follow.

## 2022-04-28 NOTE — Assessment & Plan Note (Signed)
Senna - helping.  Follow.

## 2022-04-28 NOTE — Assessment & Plan Note (Signed)
Blood pressure doing better overall.  Continue amlodipine as she is doing.  Continue to follow pressures.  Follow metabolic panel.

## 2022-04-28 NOTE — Assessment & Plan Note (Signed)
Saw ortho 04/25/22 - right knee pain.  S/p injection.  Helping. Stays active.  Walking dog.  Riding recumbent bike.

## 2022-06-20 ENCOUNTER — Other Ambulatory Visit: Payer: Self-pay | Admitting: Internal Medicine

## 2022-06-20 NOTE — Telephone Encounter (Signed)
Rx ok'd for evista and omeprazole.

## 2022-09-03 ENCOUNTER — Ambulatory Visit: Payer: Medicare HMO | Admitting: Internal Medicine

## 2022-09-13 ENCOUNTER — Encounter: Payer: Self-pay | Admitting: Internal Medicine

## 2022-09-13 ENCOUNTER — Ambulatory Visit (INDEPENDENT_AMBULATORY_CARE_PROVIDER_SITE_OTHER): Payer: Medicare HMO

## 2022-09-13 ENCOUNTER — Ambulatory Visit: Payer: Medicare HMO | Admitting: Internal Medicine

## 2022-09-13 VITALS — BP 132/78 | HR 86 | Temp 98.1°F | Resp 16 | Ht 61.0 in | Wt 119.6 lb

## 2022-09-13 DIAGNOSIS — F439 Reaction to severe stress, unspecified: Secondary | ICD-10-CM

## 2022-09-13 DIAGNOSIS — K5900 Constipation, unspecified: Secondary | ICD-10-CM | POA: Diagnosis not present

## 2022-09-13 DIAGNOSIS — M25571 Pain in right ankle and joints of right foot: Secondary | ICD-10-CM

## 2022-09-13 DIAGNOSIS — C569 Malignant neoplasm of unspecified ovary: Secondary | ICD-10-CM

## 2022-09-13 DIAGNOSIS — Z13220 Encounter for screening for lipoid disorders: Secondary | ICD-10-CM

## 2022-09-13 DIAGNOSIS — I10 Essential (primary) hypertension: Secondary | ICD-10-CM | POA: Diagnosis not present

## 2022-09-13 DIAGNOSIS — M25579 Pain in unspecified ankle and joints of unspecified foot: Secondary | ICD-10-CM | POA: Insufficient documentation

## 2022-09-13 DIAGNOSIS — R42 Dizziness and giddiness: Secondary | ICD-10-CM

## 2022-09-13 MED ORDER — AMLODIPINE BESYLATE 5 MG PO TABS: 5.0000 mg | ORAL_TABLET | Freq: Every day | ORAL | 1 refills | Status: DC

## 2022-09-13 MED ORDER — MAGNESIUM OXIDE -MG SUPPLEMENT 400 (240 MG) MG PO TABS: 400.0000 mg | ORAL_TABLET | Freq: Every day | ORAL | 1 refills | Status: DC

## 2022-09-13 MED ORDER — MELOXICAM 7.5 MG PO TABS: 7.5000 mg | ORAL_TABLET | Freq: Every day | ORAL | 0 refills | Status: DC | PRN

## 2022-09-13 MED ORDER — BUPROPION HCL 75 MG PO TABS: 75.0000 mg | ORAL_TABLET | Freq: Every day | ORAL | 1 refills | Status: DC

## 2022-09-13 MED ORDER — OMEPRAZOLE 20 MG PO CPDR: 20.0000 mg | DELAYED_RELEASE_CAPSULE | Freq: Every day | ORAL | 3 refills | Status: DC

## 2022-09-13 NOTE — Progress Notes (Signed)
Subjective:    Patient ID: Yolanda Perkins, female    DOB: 1950/08/01, 72 y.o.   MRN: [id number]  Patient here for  Chief Complaint  Patient presents with   Medical Management of Chronic Issues    HPI Here to follow up regarding hypertension and increased stress.  Continues on wellbutrin. Being followed/treated by oncology for ovarian cancer. Receiving chemo.  S/p blood transfusion.  Doing relatively well. Has noticed some dizziness. Reports occurs mostly in am - when gets up.  Occurs at night - when first lies down - room spins.  Also notices if has been sitting for a while and then gets up.  Lasts only for a few seconds.  Some minimal allergy issues.  No headache.  No persistent dizziness.  No chest pain or sob reported.  No increased heart rate or palpitations.  No abdominal pain.  Keeping bowels moving - with stool softener and senna.  Also reports increased pain and swelling (persistent) - right ankle.  Increased recently.  No recent injury or trauma.     Past Medical History:  Diagnosis Date   Chicken pox    Depression    Migraines    Urinary tract bacterial infections    Past Surgical History:  Procedure Laterality Date   HERNIA REPAIR  1962   TONSILLECTOMY AND ADENOIDECTOMY  1954   Family History  Problem Relation Age of Onset   Arthritis Mother        rheumatoid -which is in remission   Osteoporosis Mother    Transient ischemic attack Mother    Congestive Heart Failure Mother    Diverticulitis Mother    Ovarian cancer Mother    Macular degeneration Mother    Heart disease Father        s/p bypass surgery   Macular degeneration Father    Breast cancer Maternal Aunt    Uterine cancer Maternal Aunt    Uterine cancer Maternal Grandmother    Breast cancer Maternal Aunt    Breast cancer Cousin 48   Social History   Socioeconomic History   Marital status: Married    Spouse name: Not on file   Number of children: Not on file   Years of education: Not on file    Highest education level: Not on file  Occupational History   Not on file  Tobacco Use   Smoking status: Never   Smokeless tobacco: Never  Substance and Sexual Activity   Alcohol use: No    Alcohol/week: 0.0 standard drinks of alcohol   Drug use: No   Sexual activity: Not on file  Other Topics Concern   Not on file  Social History Narrative   She is married and has 2 daughters.   Social Determinants of Health   Financial Resource Strain: Low Risk  (09/18/2021)   Overall Financial Resource Strain (CARDIA)    Difficulty of Paying Living Expenses: Not hard at all  Food Insecurity: No Food Insecurity (09/18/2021)   Hunger Vital Sign    Worried About Running Out of Food in the Last Year: Never true    Ran Out of Food in the Last Year: Never true  Transportation Needs: No Transportation Needs (09/18/2021)   PRAPARE - Administrator, Civil Service (Medical): No    Lack of Transportation (Non-Medical): No  Physical Activity: Sufficiently Active (09/18/2021)   Exercise Vital Sign    Days of Exercise per Week: 5 days    Minutes of Exercise per Session:  30 min  Stress: No Stress Concern Present (09/18/2021)   [employer] of Occupational Health - Occupational Stress Questionnaire    Feeling of Stress : Not at all  Social Connections: Unknown (09/18/2021)   Social Connection and Isolation Panel [NHANES]    Frequency of Communication with Friends and Family: Not on file    Frequency of Social Gatherings with Friends and Family: Not on file    Attends Religious Services: Not on file    Active Member of Clubs or Organizations: Not on file    Attends Banker Meetings: Not on file    Marital Status: Married     Review of Systems  Constitutional:  Negative for appetite change and unexpected weight change.  HENT:  Negative for sinus pressure.        Some allergy symptoms.   Respiratory:  Negative for cough, chest tightness and shortness of breath.    Cardiovascular:  Negative for chest pain, palpitations and leg swelling.  Gastrointestinal:  Negative for abdominal pain, diarrhea, nausea and vomiting.  Genitourinary:  Negative for difficulty urinating and dysuria.  Musculoskeletal:  Negative for myalgias.       Ankle pain as outlined.   Skin:  Negative for color change and rash.  Neurological:  Negative for headaches.       Some dizziness.   Psychiatric/Behavioral:  Negative for agitation and dysphoric mood.        Objective:     BP 132/78   Pulse 86   Temp 98.1 F (36.7 C)   Resp 16   Ht 5\' 1"  (1.549 m)   Wt 119 lb 9.6 oz (54.3 kg)   LMP 04/19/1999   SpO2 98%   BMI 22.60 kg/m  Wt Readings from Last 3 Encounters:  09/13/22 119 lb 9.6 oz (54.3 kg)  04/27/22 115 lb 6.4 oz (52.3 kg)  01/26/22 112 lb 4 oz (50.9 kg)    Physical Exam Vitals reviewed.  Constitutional:      General: She is not in acute distress.    Appearance: Normal appearance.  HENT:     Head: Normocephalic and atraumatic.     Right Ear: External ear normal.     Left Ear: External ear normal.  Eyes:     General: No scleral icterus.       Right eye: No discharge.        Left eye: No discharge.     Conjunctiva/sclera: Conjunctivae normal.  Neck:     Thyroid: No thyromegaly.  Cardiovascular:     Rate and Rhythm: Normal rate and regular rhythm.  Pulmonary:     Effort: No respiratory distress.     Breath sounds: Normal breath sounds. No wheezing.  Abdominal:     General: Bowel sounds are normal.     Palpations: Abdomen is soft.     Tenderness: There is no abdominal tenderness.  Musculoskeletal:     Cervical back: Neck supple. No tenderness.     Comments: Increased swelling/tenderness - ankle.    Lymphadenopathy:     Cervical: No cervical adenopathy.  Skin:    Findings: No erythema or rash.  Neurological:     Mental Status: She is alert.     Comments: Reproducible dizziness - with lying flat and rotation of head.   Psychiatric:        Mood  and Affect: Mood normal.        Behavior: Behavior normal.      Outpatient Encounter Medications as of 09/13/2022  Medication Sig   amLODipine (NORVASC) 5 MG tablet Take 1 tablet (5 mg total) by mouth daily.   buPROPion (WELLBUTRIN) 75 MG tablet Take 1 tablet (75 mg total) by mouth daily.   Calcium Carbonate-Vit D-Min 600-400 MG-UNIT TABS Take 1 tablet by mouth 2 (two) times daily.   Cholecalciferol (VITAMIN D3) 1000 UNITS CAPS Take 1 capsule by mouth daily.   fluticasone (FLONASE) 50 MCG/ACT nasal spray Place into the nose.   loratadine (CLARITIN) 10 MG tablet Take by mouth.   magnesium oxide (MAG-OX) 400 (240 Mg) MG tablet Take 1 tablet (400 mg total) by mouth daily.   MAGNESIUM PO Take by mouth.   melatonin 3 MG TABS tablet Take 3 mg by mouth at bedtime.   meloxicam (MOBIC) 7.5 MG tablet Take 1 tablet (7.5 mg total) by mouth daily as needed for pain. With meals   Multiple Vitamin (MULTIVITAMIN) tablet Take 1 tablet by mouth daily.   omeprazole (PRILOSEC) 20 MG capsule Take 1 capsule (20 mg total) by mouth daily.   ondansetron (ZOFRAN) 8 MG tablet Take by mouth.   pyridoxine (B-6) 100 MG tablet Take 100 mg by mouth daily.   raloxifene (EVISTA) 60 MG tablet Take 1 tablet (60 mg total) by mouth daily.   SENNA PLUS 8.6-50 MG tablet    SUMAtriptan (IMITREX) 50 MG tablet Take 0.5 tablets (25 mg total) by mouth as needed for migraine. May repeat in 2 hours if headache persists or recurs.   triamcinolone cream (KENALOG) 0.1 % Apply 1 application topically 2 (two) times daily.   UNABLE TO FIND Med Name: ZnC3   zinc gluconate 50 MG tablet Take 50 mg by mouth daily.   [DISCONTINUED] amLODipine (NORVASC) 5 MG tablet Take 1 tablet (5 mg total) by mouth daily.   [DISCONTINUED] buPROPion (WELLBUTRIN) 75 MG tablet Take 1 tablet (75 mg total) by mouth daily.   [DISCONTINUED] magnesium oxide (MAG-OX) 400 (240 Mg) MG tablet Take 1 tablet (400 mg total) by mouth daily.   [DISCONTINUED] meloxicam (MOBIC)  7.5 MG tablet Take 1 tablet (7.5 mg total) by mouth daily as needed for pain. With meals   [DISCONTINUED] omeprazole (PRILOSEC) 20 MG capsule Take 1 capsule (20 mg total) by mouth daily.   No facility-administered encounter medications on file as of 09/13/2022.     Lab Results  Component Value Date   WBC 6.2 12/29/2019   HGB 13.2 12/29/2019   HCT 39.6 12/29/2019   PLT 252.0 12/29/2019   GLUCOSE 93 12/29/2019   CHOL 195 01/26/2022   TRIG 125.0 01/26/2022   HDL 54.90 01/26/2022   LDLCALC 115 (H) 01/26/2022   ALT 15 12/29/2019   AST 26 12/29/2019   NA 140 12/29/2019   K 4.3 12/29/2019   CL 104 12/29/2019   CREATININE 1.00 12/29/2019   BUN 15 12/29/2019   CO2 32 12/29/2019   TSH 1.53 07/09/2019    MM 3D SCREEN BREAST BILATERAL  Result Date: 02/20/2022 CLINICAL DATA:  Screening. EXAM: DIGITAL SCREENING BILATERAL MAMMOGRAM WITH TOMOSYNTHESIS AND CAD TECHNIQUE: Bilateral screening digital craniocaudal and mediolateral oblique mammograms were obtained. Bilateral screening digital breast tomosynthesis was performed. The images were evaluated with computer-aided detection. COMPARISON:  Previous exam(s). ACR Breast Density Category c: The breast tissue is heterogeneously dense, which may obscure small masses. FINDINGS: There are no findings suspicious for malignancy. IMPRESSION: No mammographic evidence of malignancy. A result letter of this screening mammogram will be mailed directly to the patient. RECOMMENDATION: Screening mammogram in one year. (  Code:SM-B-01Y) BI-RADS CATEGORY  1: Negative. Electronically Signed   By: Annia Belt M.D.   On: 02/20/2022 06:04       Assessment & Plan:  Screening cholesterol level -     Lipid panel; Future  Right ankle pain, unspecified chronicity Assessment & Plan: Persistent ankle pain and swelling.  Check xray.  Further w/up pending results.   Orders: -     DG Ankle 2 Views Right; Future  Constipation, unspecified constipation type Assessment &  Plan: Keeping bowels moving with senna and stool softener.    Hypertension, unspecified type Assessment & Plan: Blood pressure doing better overall.  Continue amlodipine as she is doing.  Continue to follow pressures.  Follow metabolic panel.    Malignant neoplasm of ovary, unspecified laterality [facility]) Assessment & Plan: Being followed/treated by oncology for ovarian cancer. Receiving chemo.  S/p blood transfusion.  Doing relatively well.    Stress Assessment & Plan: On wellbutrin. Doing well on this dose.  Has good support.  Follow.    Dizziness Assessment & Plan: Dizziness as outlined.  Reproducible on exam.  Appears to be c/w vertigo.  Discussed ENT referral and testing.  Discussed epley maneuvers.  Will notify me if desires further w/up and evaluation.    Other orders -     amLODIPine Besylate; Take 1 tablet (5 mg total) by mouth daily.  Dispense: 90 tablet; Refill: 1 -     buPROPion HCl; Take 1 tablet (75 mg total) by mouth daily.  Dispense: 90 tablet; Refill: 1 -     Meloxicam; Take 1 tablet (7.5 mg total) by mouth daily as needed for pain. With meals  Dispense: 90 tablet; Refill: 0 -     Omeprazole; Take 1 capsule (20 mg total) by mouth daily.  Dispense: 90 capsule; Refill: 3 -     Magnesium Oxide -Mg Supplement; Take 1 tablet (400 mg total) by mouth daily.  Dispense: 90 tablet; Refill: 1     Dale , MD

## 2022-09-19 ENCOUNTER — Encounter: Payer: Self-pay | Admitting: Internal Medicine

## 2022-09-19 DIAGNOSIS — R42 Dizziness and giddiness: Secondary | ICD-10-CM | POA: Insufficient documentation

## 2022-09-19 NOTE — Assessment & Plan Note (Signed)
Dizziness as outlined.  Reproducible on exam.  Appears to be c/w vertigo.  Discussed ENT referral and testing.  Discussed epley maneuvers.  Will notify me if desires further w/up and evaluation.

## 2022-09-19 NOTE — Assessment & Plan Note (Signed)
Blood pressure doing better overall.  Continue amlodipine as she is doing.  Continue to follow pressures.  Follow metabolic panel.  

## 2022-09-19 NOTE — Assessment & Plan Note (Signed)
Keeping bowels moving with senna and stool softener.

## 2022-09-19 NOTE — Assessment & Plan Note (Signed)
On wellbutrin. Doing well on this dose.  Has good support.  Follow.  

## 2022-09-19 NOTE — Assessment & Plan Note (Signed)
Persistent ankle pain and swelling.  Check xray.  Further w/up pending results.

## 2022-09-19 NOTE — Assessment & Plan Note (Signed)
Being followed/treated by oncology for ovarian cancer. Receiving chemo.  S/p blood transfusion.  Doing relatively well.

## 2022-09-20 ENCOUNTER — Ambulatory Visit (INDEPENDENT_AMBULATORY_CARE_PROVIDER_SITE_OTHER): Payer: Medicare HMO

## 2022-09-20 VITALS — BP 131/76 | Ht 62.0 in | Wt 119.0 lb

## 2022-09-20 DIAGNOSIS — Z0000 Encounter for general adult medical examination without abnormal findings: Secondary | ICD-10-CM | POA: Diagnosis not present

## 2022-09-20 NOTE — Progress Notes (Signed)
Subjective:   Yolanda Perkins is a 72 y.o. female who presents for Medicare Annual (Subsequent) preventive examination.  Visit Complete: Virtual  I connected with  [name] on 09/20/22 by a audio enabled telemedicine application and verified that I am speaking with the correct person using two identifiers.  Patient Location: Home  Provider Location: Home Office  I discussed the limitations of evaluation and management by telemedicine. The patient expressed understanding and agreed to proceed.  Patient Medicare AWV questionnaire was completed by the patient on n/a; I have confirmed that all information answered by patient is correct and no changes since this date.  Review of Systems           Objective:    There were no vitals filed for this visit. There is no height or weight on file to calculate BMI.     09/18/2021   11:09 AM 09/15/2020    9:16 AM 07/24/2019   10:48 AM 07/23/2018   10:38 AM  Advanced Directives  Does Patient Have a Medical Advance Directive? No No Yes No  Does patient want to make changes to medical advance directive?   No - Patient declined   Would patient like information on creating a medical advance directive? No - Patient declined No - Patient declined  No - Patient declined    Current Medications (verified) Outpatient Encounter Medications as of 09/20/2022  Medication Sig   amLODipine (NORVASC) 5 MG tablet Take 1 tablet (5 mg total) by mouth daily.   buPROPion (WELLBUTRIN) 75 MG tablet Take 1 tablet (75 mg total) by mouth daily.   Calcium Carbonate-Vit D-Min 600-400 MG-UNIT TABS Take 1 tablet by mouth 2 (two) times daily.   Cholecalciferol (VITAMIN D3) 1000 UNITS CAPS Take 1 capsule by mouth daily.   fluticasone (FLONASE) 50 MCG/ACT nasal spray Place into the nose.   loratadine (CLARITIN) 10 MG tablet Take by mouth.   magnesium oxide (MAG-OX) 400 (240 Mg) MG tablet Take 1 tablet (400 mg total) by mouth daily.   MAGNESIUM PO Take by mouth.    melatonin 3 MG TABS tablet Take 3 mg by mouth at bedtime.   meloxicam (MOBIC) 7.5 MG tablet Take 1 tablet (7.5 mg total) by mouth daily as needed for pain. With meals   Multiple Vitamin (MULTIVITAMIN) tablet Take 1 tablet by mouth daily.   omeprazole (PRILOSEC) 20 MG capsule Take 1 capsule (20 mg total) by mouth daily.   ondansetron (ZOFRAN) 8 MG tablet Take by mouth.   pyridoxine (B-6) 100 MG tablet Take 100 mg by mouth daily.   raloxifene (EVISTA) 60 MG tablet Take 1 tablet (60 mg total) by mouth daily.   SENNA PLUS 8.6-50 MG tablet    SUMAtriptan (IMITREX) 50 MG tablet Take 0.5 tablets (25 mg total) by mouth as needed for migraine. May repeat in 2 hours if headache persists or recurs.   triamcinolone cream (KENALOG) 0.1 % Apply 1 application topically 2 (two) times daily.   UNABLE TO FIND Med Name: ZnC3   zinc gluconate 50 MG tablet Take 50 mg by mouth daily.   No facility-administered encounter medications on file as of 09/20/2022.    Allergies (verified) Penicillins   History: Past Medical History:  Diagnosis Date   Chicken pox    Depression    Migraines    Urinary tract bacterial infections    Past Surgical History:  Procedure Laterality Date   HERNIA REPAIR  1962   TONSILLECTOMY AND ADENOIDECTOMY  1954  Family History  Problem Relation Age of Onset   Arthritis Mother        rheumatoid -which is in remission   Osteoporosis Mother    Transient ischemic attack Mother    Congestive Heart Failure Mother    Diverticulitis Mother    Ovarian cancer Mother    Macular degeneration Mother    Heart disease Father        s/p bypass surgery   Macular degeneration Father    Breast cancer Maternal Aunt    Uterine cancer Maternal Aunt    Uterine cancer Maternal Grandmother    Breast cancer Maternal Aunt    Breast cancer Cousin 22   Social History   Socioeconomic History   Marital status: Married    Spouse name: Not on file   Number of children: Not on file   Years of  education: Not on file   Highest education level: Not on file  Occupational History   Not on file  Tobacco Use   Smoking status: Never   Smokeless tobacco: Never  Substance and Sexual Activity   Alcohol use: No    Alcohol/week: 0.0 standard drinks of alcohol   Drug use: No   Sexual activity: Not on file  Other Topics Concern   Not on file  Social History Narrative   She is married and has 2 daughters.   Social Determinants of Health   Financial Resource Strain: Low Risk  (09/18/2021)   Overall Financial Resource Strain (CARDIA)    Difficulty of Paying Living Expenses: Not hard at all  Food Insecurity: No Food Insecurity (09/18/2021)   Hunger Vital Sign    Worried About Running Out of Food in the Last Year: Never true    Ran Out of Food in the Last Year: Never true  Transportation Needs: No Transportation Needs (09/18/2021)   PRAPARE - Administrator, Civil Service (Medical): No    Lack of Transportation (Non-Medical): No  Physical Activity: Sufficiently Active (09/18/2021)   Exercise Vital Sign    Days of Exercise per Week: 5 days    Minutes of Exercise per Session: 30 min  Stress: No Stress Concern Present (09/18/2021)   [employer] of Occupational Health - Occupational Stress Questionnaire    Feeling of Stress : Not at all  Social Connections: Unknown (09/18/2021)   Social Connection and Isolation Panel [NHANES]    Frequency of Communication with Friends and Family: Not on file    Frequency of Social Gatherings with Friends and Family: Not on file    Attends Religious Services: Not on file    Active Member of Clubs or Organizations: Not on file    Attends Banker Meetings: Not on file    Marital Status: Married    Tobacco Counseling Counseling given: Not Answered   Clinical Intake:                        Activities of Daily Living     No data to display          Patient Care Team: Dale Rosepine, MD as PCP -  General (Internal Medicine)  Indicate any recent Medical Services you may have received from other than [facility] providers in the past year (date may be approximate).     Assessment:   This is a routine wellness examination for [name].  Hearing/Vision screen No results found.  Dietary issues and exercise activities discussed:     Goals  Addressed   None    Depression Screen    01/26/2022    8:10 AM 12/21/2021    7:35 AM 11/27/2021   11:31 AM 10/19/2021    8:21 AM 09/18/2021   11:08 AM 10/10/2020   11:34 AM 09/15/2020    9:14 AM  PHQ 2/9 Scores  PHQ - 2 Score 0 0 0 0 0 0 0    Fall Risk    01/26/2022    8:09 AM 12/21/2021    7:35 AM 11/27/2021   11:31 AM 10/19/2021    8:21 AM 09/18/2021   11:11 AM  Fall Risk   Falls in the past year? 1 0 0 0 0  Number falls in past yr: 1  0 0   Injury with Fall? 0  0 0   Risk for fall due to : No Fall Risks No Fall Risks No Fall Risks No Fall Risks   Follow up Falls evaluation completed Falls evaluation completed Falls evaluation completed Falls evaluation completed Falls evaluation completed    MEDICARE RISK AT HOME:   TIMED UP AND GO:  Was the test performed?  No    Cognitive Function:        07/24/2019   10:58 AM 07/23/2018   10:42 AM  6CIT Screen  What Year? 0 points 0 points  What month? 0 points 0 points  What time? 0 points 0 points  Count back from 20  0 points  Months in reverse 0 points 0 points  Repeat phrase  0 points  Total Score  0 points    Immunizations Immunization History  Administered Date(s) Administered   Fluad Quad(high Dose 65+) 12/25/2018, 02/15/2021, 12/21/2021   Hepatitis B 04/03/1991   Influenza Split 01/17/2012   Influenza, High Dose Seasonal PF 04/23/2016, 02/04/2018   Influenza,inj,Quad PF,6+ Mos 12/21/2013, 01/25/2015   Influenza-Unspecified 12/21/2013, 01/25/2015, 04/23/2016, 03/04/2017, 01/01/2020, 01/20/2020   Moderna Sars-Covid-2 Vaccination 06/03/2019, 07/02/2019   Pneumococcal  Conjugate-13 07/27/2014   Pneumococcal Polysaccharide-23 03/11/2018   Tdap 08/31/2013   Zoster, Live 05/27/2015    TDAP status: Up to date  Flu Vaccine status: Up to date  Pneumococcal vaccine status: Up to date  Covid-19 vaccine status: Information provided on how to obtain vaccines.   Qualifies for Shingles Vaccine? Yes   Zostavax completed No   Shingrix Completed?: No.    Education has been provided regarding the importance of this vaccine. Patient has been advised to call insurance company to determine out of pocket expense if they have not yet received this vaccine. Advised may also receive vaccine at local pharmacy or Health Dept. Verbalized acceptance and understanding.  Screening Tests Health Maintenance  Topic Date Due   Medicare Annual Wellness (AWV)  09/19/2022   COVID-19 Vaccine (3 - Moderna risk series) 09/29/2022 (Originally 07/30/2019)   Zoster Vaccines- Shingrix (1 of 2) 12/14/2022 (Originally 06/16/1969)   INFLUENZA VACCINE  11/01/2022   MAMMOGRAM  02/17/2023   DTaP/Tdap/Td (2 - Td or Tdap) 09/01/2023   Colonoscopy  10/19/2024   Pneumonia Vaccine 78+ Years old  Completed   DEXA SCAN  Completed   Hepatitis C Screening  Completed   HPV VACCINES  Aged Out    Health Maintenance  Health Maintenance Due  Topic Date Due   Medicare Annual Wellness (AWV)  09/19/2022    Colorectal cancer screening: Type of screening: Colonoscopy. Completed 10/20/2014. Repeat every 10 years  Mammogram status: Completed 02/16/2022. Repeat every year  Bone Density status: Completed 01/19/2019. Results reflect: Bone density  results: OSTEOPENIA. Repeat every 2-5 years.  Lung Cancer Screening: (Low Dose CT Chest recommended if Age 5-80 years, 20 pack-year currently smoking OR have quit w/in 15years.) does not qualify.    Additional Screening:  Hepatitis C Screening: does not qualify; Completed 06/25/2013  Vision Screening: Recommended annual ophthalmology exams for early detection  of glaucoma and other disorders of the eye. Is the patient up to date with their annual eye exam?  Yes  Who is the provider or what is the name of the office in which the patient attends annual eye exams? [facility] If pt is not established with a provider, would they like to be referred to a provider to establish care? No .   Dental Screening: Recommended annual dental exams for proper oral hygiene  Diabetic Foot Exam: n/a  Community Resource Referral / Chronic Care Management: CRR required this visit?  No   CCM required this visit?  No     Plan:     I have personally reviewed and noted the following in the patient's chart:   Medical and social history Use of alcohol, tobacco or illicit drugs  Current medications and supplements including opioid prescriptions. Patient is not currently taking opioid prescriptions. Functional ability and status Nutritional status Physical activity Advanced directives List of other physicians Hospitalizations, surgeries, and ER visits in previous 12 months Vitals Screenings to include cognitive, depression, and falls Referrals and appointments  In addition, I have reviewed and discussed with patient certain preventive protocols, quality metrics, and best practice recommendations. A written personalized care plan for preventive services as well as general preventive health recommendations were provided to patient.   Because this visit was a virtual/telehealth visit,  certain criteria was not obtained, such a blood pressure, CBG if patient is a diabetic, and timed up and go.    Jordan Hawks Montgomery Favor, CMA   09/20/2022   After Visit Summary: (MyChart) Due to this being a telephonic visit, the after visit summary with patients personalized plan was offered to patient via MyChart   Nurse Notes: health maintenance is up to date

## 2022-09-20 NOTE — Patient Instructions (Signed)
[name] , Thank you for taking time to come for your Medicare Wellness Visit. I appreciate your ongoing commitment to your health goals. Please review the following plan we discussed and let me know if I can assist you in the future.   These are the goals we discussed:  Goals       Increase physiacl activity (pt-stated)      Ride stationary bike      Patient Stated      To live as healthy as possible         This is a list of the screening recommended for you and due dates:  Health Maintenance  Topic Date Due   COVID-19 Vaccine (3 - Moderna risk series) 09/29/2022*   Zoster (Shingles) Vaccine (1 of 2) 12/14/2022*   Flu Shot  11/01/2022   Mammogram  02/17/2023   DTaP/Tdap/Td vaccine (2 - Td or Tdap) 09/01/2023   Medicare Annual Wellness Visit  09/20/2023   Colon Cancer Screening  10/19/2024   Pneumonia Vaccine  Completed   DEXA scan (bone density measurement)  Completed   Hepatitis C Screening  Completed   HPV Vaccine  Aged Out  *Topic was postponed. The date shown is not the original due date.    Advanced directives: Advance directive discussed with you today. Even though you declined this today, please call our office should you change your mind, and we can give you the proper paperwork for you to fill out. Advance care planning is a way to make decisions about medical care that fits your values in case you are ever unable to make these decisions for yourself.  Information on Advanced Care Planning can be found at [facility] Advance Health Care Directives Advance Health Care Directives ([email or web address]    Conditions/risks identified: You can have the bone density scan completed again.   Next appointment: VIRTUAL/TELEPHONE APPOINTMENT Follow up in one year for your annual wellness visit  September 23, 2023 at 10:15am   Preventive Care 65 Years and Older, Female Preventive care refers to lifestyle choices and visits with your health care provider that can  promote health and wellness. What does preventive care include? A yearly physical exam. This is also called an annual well check. Dental exams once or twice a year. Routine eye exams. Ask your health care provider how often you should have your eyes checked. Personal lifestyle choices, including: Daily care of your teeth and gums. Regular physical activity. Eating a healthy diet. Avoiding tobacco and drug use. Limiting alcohol use. Practicing safe sex. Taking low-dose aspirin every day. Taking vitamin and mineral supplements as recommended by your health care provider. What happens during an annual well check? The services and screenings done by your health care provider during your annual well check will depend on your age, overall health, lifestyle risk factors, and family history of disease. Counseling  Your health care provider may ask you questions about your: Alcohol use. Tobacco use. Drug use. Emotional well-being. Home and relationship well-being. Sexual activity. Eating habits. History of falls. Memory and ability to understand (cognition). Work and work Astronomer. Reproductive health. Screening  You may have the following tests or measurements: Height, weight, and BMI. Blood pressure. Lipid and cholesterol levels. These may be checked every 5 years, or more frequently if you are over 51 years old. Skin check. Lung cancer screening. You may have this screening every year starting at age 52 if you have a 30-pack-year history of smoking and currently smoke  or have quit within the past 15 years. Fecal occult blood test (FOBT) of the stool. You may have this test every year starting at age 48. Flexible sigmoidoscopy or colonoscopy. You may have a sigmoidoscopy every 5 years or a colonoscopy every 10 years starting at age 59. Hepatitis C blood test. Hepatitis B blood test. Sexually transmitted disease (STD) testing. Diabetes screening. This is done by checking your  blood sugar (glucose) after you have not eaten for a while (fasting). You may have this done every 1-3 years. Bone density scan. This is done to screen for osteoporosis. You may have this done starting at age 53. Mammogram. This may be done every 1-2 years. Talk to your health care provider about how often you should have regular mammograms. Talk with your health care provider about your test results, treatment options, and if necessary, the need for more tests. Vaccines  Your health care provider may recommend certain vaccines, such as: Influenza vaccine. This is recommended every year. Tetanus, diphtheria, and acellular pertussis (Tdap, Td) vaccine. You may need a Td booster every 10 years. Zoster vaccine. You may need this after age 62. Pneumococcal 13-valent conjugate (PCV13) vaccine. One dose is recommended after age 24. Pneumococcal polysaccharide (PPSV23) vaccine. One dose is recommended after age 38. Talk to your health care provider about which screenings and vaccines you need and how often you need them. This information is not intended to replace advice given to you by your health care provider. Make sure you discuss any questions you have with your health care provider. Document Released: 04/15/2015 Document Revised: 12/07/2015 Document Reviewed: 01/18/2015 Elsevier Interactive Patient Education  2017 [employer].  Fall Prevention in the Home Falls can cause injuries. They can happen to people of all ages. There are many things you can do to make your home safe and to help prevent falls. What can I do on the outside of my home? Regularly fix the edges of walkways and driveways and fix any cracks. Remove anything that might make you trip as you walk through a door, such as a raised step or threshold. Trim any bushes or trees on the path to your home. Use bright outdoor lighting. Clear any walking paths of anything that might make someone trip, such as rocks or tools. Regularly  check to see if handrails are loose or broken. Make sure that both sides of any steps have handrails. Any raised decks and porches should have guardrails on the edges. Have any leaves, snow, or ice cleared regularly. Use sand or salt on walking paths during winter. Clean up any spills in your garage right away. This includes oil or grease spills. What can I do in the bathroom? Use night lights. Install grab bars by the toilet and in the tub and shower. Do not use towel bars as grab bars. Use non-skid mats or decals in the tub or shower. If you need to sit down in the shower, use a plastic, non-slip stool. Keep the floor dry. Clean up any water that spills on the floor as soon as it happens. Remove soap buildup in the tub or shower regularly. Attach bath mats securely with double-sided non-slip rug tape. Do not have throw rugs and other things on the floor that can make you trip. What can I do in the bedroom? Use night lights. Make sure that you have a light by your bed that is easy to reach. Do not use any sheets or blankets that are too big for  your bed. They should not hang down onto the floor. Have a firm chair that has side arms. You can use this for support while you get dressed. Do not have throw rugs and other things on the floor that can make you trip. What can I do in the kitchen? Clean up any spills right away. Avoid walking on wet floors. Keep items that you use a lot in easy-to-reach places. If you need to reach something above you, use a strong step stool that has a grab bar. Keep electrical cords out of the way. Do not use floor polish or wax that makes floors slippery. If you must use wax, use non-skid floor wax. Do not have throw rugs and other things on the floor that can make you trip. What can I do with my stairs? Do not leave any items on the stairs. Make sure that there are handrails on both sides of the stairs and use them. Fix handrails that are broken or loose.  Make sure that handrails are as long as the stairways. Check any carpeting to make sure that it is firmly attached to the stairs. Fix any carpet that is loose or worn. Avoid having throw rugs at the top or bottom of the stairs. If you do have throw rugs, attach them to the floor with carpet tape. Make sure that you have a light switch at the top of the stairs and the bottom of the stairs. If you do not have them, ask someone to add them for you. What else can I do to help prevent falls? Wear shoes that: Do not have high heels. Have rubber bottoms. Are comfortable and fit you well. Are closed at the toe. Do not wear sandals. If you use a stepladder: Make sure that it is fully opened. Do not climb a closed stepladder. Make sure that both sides of the stepladder are locked into place. Ask someone to hold it for you, if possible. Clearly mark and make sure that you can see: Any grab bars or handrails. First and last steps. Where the edge of each step is. Use tools that help you move around (mobility aids) if they are needed. These include: Canes. Walkers. Scooters. Crutches. Turn on the lights when you go into a dark area. Replace any light bulbs as soon as they burn out. Set up your furniture so you have a clear path. Avoid moving your furniture around. If any of your floors are uneven, fix them. If there are any pets around you, be aware of where they are. Review your medicines with your doctor. Some medicines can make you feel dizzy. This can increase your chance of falling. Ask your doctor what other things that you can do to help prevent falls. This information is not intended to replace advice given to you by your health care provider. Make sure you discuss any questions you have with your health care provider. Document Released: 01/13/2009 Document Revised: 08/25/2015 Document Reviewed: 04/23/2014 Elsevier Interactive Patient Education  2017 [employer].

## 2022-11-28 ENCOUNTER — Encounter: Payer: Self-pay | Admitting: Internal Medicine

## 2022-11-28 MED ORDER — BUPROPION HCL 75 MG PO TABS: 75.0000 mg | ORAL_TABLET | Freq: Every day | ORAL | 1 refills | Status: DC

## 2023-01-14 ENCOUNTER — Ambulatory Visit: Payer: Medicare HMO | Admitting: Internal Medicine

## 2023-01-14 ENCOUNTER — Encounter: Payer: Self-pay | Admitting: Internal Medicine

## 2023-01-14 VITALS — BP 122/70 | HR 87 | Temp 98.2°F | Resp 16 | Ht 62.0 in | Wt 119.8 lb

## 2023-01-14 DIAGNOSIS — Z8616 Personal history of COVID-19: Secondary | ICD-10-CM

## 2023-01-14 DIAGNOSIS — Z23 Encounter for immunization: Secondary | ICD-10-CM | POA: Diagnosis not present

## 2023-01-14 DIAGNOSIS — M8580 Other specified disorders of bone density and structure, unspecified site: Secondary | ICD-10-CM | POA: Diagnosis not present

## 2023-01-14 DIAGNOSIS — Z1231 Encounter for screening mammogram for malignant neoplasm of breast: Secondary | ICD-10-CM

## 2023-01-14 DIAGNOSIS — I10 Essential (primary) hypertension: Secondary | ICD-10-CM | POA: Diagnosis not present

## 2023-01-14 DIAGNOSIS — M25561 Pain in right knee: Secondary | ICD-10-CM

## 2023-01-14 DIAGNOSIS — F439 Reaction to severe stress, unspecified: Secondary | ICD-10-CM | POA: Diagnosis not present

## 2023-01-14 DIAGNOSIS — C569 Malignant neoplasm of unspecified ovary: Secondary | ICD-10-CM | POA: Diagnosis not present

## 2023-01-14 DIAGNOSIS — M25562 Pain in left knee: Secondary | ICD-10-CM

## 2023-01-14 MED ORDER — AMLODIPINE BESYLATE 5 MG PO TABS: 5.0000 mg | ORAL_TABLET | Freq: Every day | ORAL | 1 refills | Status: DC

## 2023-01-14 MED ORDER — RALOXIFENE HCL 60 MG PO TABS: 60.0000 mg | ORAL_TABLET | Freq: Every day | ORAL | 1 refills | Status: DC

## 2023-01-14 MED ORDER — MAGNESIUM OXIDE -MG SUPPLEMENT 400 (240 MG) MG PO TABS: 400.0000 mg | ORAL_TABLET | Freq: Every day | ORAL | 1 refills | Status: DC

## 2023-01-14 NOTE — Progress Notes (Signed)
Subjective:    Patient ID: Yolanda Perkins, female    DOB: 01-07-1951, 72 y.o.   MRN: [id number]  Patient here for  Chief Complaint  Patient presents with   Medical Management of Chronic Issues    HPI Here for a scheduled follow up.  Follow up regarding hypertension.  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  Started gemcitabine 01/08/23.  Tolerated.  No problems.  Is eating.  No nausea or vomiting.  Trying to stay active.  No chest pain reported.  No abdominal pain.  Takes senna and stool softener and this keeps bowels regular.  Handling stress.  Had covid in 12/2022.  No significant symptoms.  Took paxlovid.  Overall doing well.  Due to get injection - knee in 02/2023.  Still some right ankle swelling, but no leg swelling or pain.    Past Medical History:  Diagnosis Date   Chicken pox    Depression    Migraines    Urinary tract bacterial infections    Past Surgical History:  Procedure Laterality Date   ABDOMINAL HYSTERECTOMY N/A 05/05/2020   HERNIA REPAIR  04/02/1960   TONSILLECTOMY AND ADENOIDECTOMY  04/02/1952   Family History  Problem Relation Age of Onset   Arthritis Mother        rheumatoid -which is in remission   Osteoporosis Mother    Transient ischemic attack Mother    Congestive Heart Failure Mother    Diverticulitis Mother    Ovarian cancer Mother    Macular degeneration Mother    Heart disease Father        s/p bypass surgery   Macular degeneration Father    Breast cancer Maternal Aunt    Uterine cancer Maternal Aunt    Uterine cancer Maternal Grandmother    Breast cancer Maternal Aunt    Breast cancer Cousin 77   Social History   Socioeconomic History   Marital status: Married    Spouse name: Not on file   Number of children: Not on file   Years of education: Not on file   Highest education level: Not on file  Occupational History   Not on file  Tobacco Use   Smoking status: Never   Smokeless tobacco: Never   Substance and Sexual Activity   Alcohol use: No    Alcohol/week: 0.0 standard drinks of alcohol   Drug use: No   Sexual activity: Not on file  Other Topics Concern   Not on file  Social History Narrative   She is married and has 2 daughters.   Social Determinants of Health   Financial Resource Strain: Low Risk  (09/20/2022)   Overall Financial Resource Strain (CARDIA)    Difficulty of Paying Living Expenses: Not hard at all  Food Insecurity: No Food Insecurity (09/20/2022)   Hunger Vital Sign    Worried About Running Out of Food in the Last Year: Never true    Ran Out of Food in the Last Year: Never true  Transportation Needs: No Transportation Needs (09/20/2022)   PRAPARE - Administrator, Civil Service (Medical): No    Lack of Transportation (Non-Medical): No  Physical Activity: Sufficiently Active (09/20/2022)   Exercise Vital Sign    Days of Exercise per Week: 7 days    Minutes of Exercise per Session: 30 min  Stress: No Stress Concern Present (09/20/2022)   [employer] of Occupational Health - Occupational Stress Questionnaire    Feeling of  Stress : Only a little  Social Connections: Socially Integrated (09/20/2022)   Social Connection and Isolation Panel [NHANES]    Frequency of Communication with Friends and Family: More than three times a week    Frequency of Social Gatherings with Friends and Family: More than three times a week    Attends Religious Services: More than 4 times per year    Active Member of [employer] or Organizations: Yes    Attends Engineer, structural: More than 4 times per year    Marital Status: Married     Review of Systems  Constitutional:  Negative for appetite change and unexpected weight change.  HENT:  Negative for congestion and sinus pressure.   Respiratory:  Negative for cough, chest tightness and shortness of breath.   Cardiovascular:  Negative for chest pain and palpitations.       Ankle swelling - right.    Gastrointestinal:  Negative for abdominal pain, diarrhea, nausea and vomiting.  Genitourinary:  Negative for difficulty urinating and dysuria.  Musculoskeletal:  Negative for joint swelling and myalgias.       Knee pain as outlined.   Skin:  Negative for color change and rash.  Neurological:  Negative for dizziness and headaches.  Psychiatric/Behavioral:  Negative for agitation and dysphoric mood.        Objective:     BP 122/70   Pulse 87   Temp 98.2 F (36.8 C)   Resp 16   Ht 5\' 2"  (1.575 m)   Wt 119 lb 12.8 oz (54.3 kg)   LMP 04/19/1999   SpO2 98%   BMI 21.91 kg/m  Wt Readings from Last 3 Encounters:  01/14/23 119 lb 12.8 oz (54.3 kg)  09/20/22 119 lb (54 kg)  09/13/22 119 lb 9.6 oz (54.3 kg)    Physical Exam Vitals reviewed.  Constitutional:      General: She is not in acute distress.    Appearance: Normal appearance.  HENT:     Head: Normocephalic and atraumatic.     Right Ear: External ear normal.     Left Ear: External ear normal.  Eyes:     General: No scleral icterus.       Right eye: No discharge.        Left eye: No discharge.     Conjunctiva/sclera: Conjunctivae normal.  Neck:     Thyroid: No thyromegaly.  Cardiovascular:     Rate and Rhythm: Normal rate and regular rhythm.  Pulmonary:     Effort: No respiratory distress.     Breath sounds: Normal breath sounds. No wheezing.  Abdominal:     General: Bowel sounds are normal.     Palpations: Abdomen is soft.     Tenderness: There is no abdominal tenderness.  Musculoskeletal:        General: No tenderness.     Cervical back: Neck supple. No tenderness.     Comments: No significant swelling.  DP pulses palpable and equal bilaterally.   Lymphadenopathy:     Cervical: No cervical adenopathy.  Skin:    Findings: No erythema or rash.  Neurological:     Mental Status: She is alert.  Psychiatric:        Mood and Affect: Mood normal.        Behavior: Behavior normal.      Outpatient Encounter  Medications as of 01/14/2023  Medication Sig   amLODipine (NORVASC) 5 MG tablet Take 1 tablet (5 mg total) by mouth daily.  buPROPion (WELLBUTRIN) 75 MG tablet Take 1 tablet (75 mg total) by mouth daily.   Calcium Carbonate-Vit D-Min 600-400 MG-UNIT TABS Take 1 tablet by mouth 2 (two) times daily.   Cholecalciferol (VITAMIN D3) 1000 UNITS CAPS Take 1 capsule by mouth daily.   fluticasone (FLONASE) 50 MCG/ACT nasal spray Place into the nose.   loratadine (CLARITIN) 10 MG tablet Take by mouth.   magnesium oxide (MAG-OX) 400 (240 Mg) MG tablet Take 1 tablet (400 mg total) by mouth daily.   MAGNESIUM PO Take by mouth.   melatonin 3 MG TABS tablet Take 3 mg by mouth at bedtime.   meloxicam (MOBIC) 7.5 MG tablet Take 1 tablet (7.5 mg total) by mouth daily as needed for pain. With meals   Multiple Vitamin (MULTIVITAMIN) tablet Take 1 tablet by mouth daily.   omeprazole (PRILOSEC) 20 MG capsule Take 1 capsule (20 mg total) by mouth daily.   ondansetron (ZOFRAN) 8 MG tablet Take by mouth.   pyridoxine (B-6) 100 MG tablet Take 100 mg by mouth daily.   raloxifene (EVISTA) 60 MG tablet Take 1 tablet (60 mg total) by mouth daily.   SENNA PLUS 8.6-50 MG tablet    SUMAtriptan (IMITREX) 50 MG tablet Take 0.5 tablets (25 mg total) by mouth as needed for migraine. May repeat in 2 hours if headache persists or recurs.   triamcinolone cream (KENALOG) 0.1 % Apply 1 application topically 2 (two) times daily.   UNABLE TO FIND Med Name: ZnC3   zinc gluconate 50 MG tablet Take 50 mg by mouth daily.   [DISCONTINUED] amLODipine (NORVASC) 5 MG tablet Take 1 tablet (5 mg total) by mouth daily.   [DISCONTINUED] magnesium oxide (MAG-OX) 400 (240 Mg) MG tablet Take 1 tablet (400 mg total) by mouth daily.   [DISCONTINUED] raloxifene (EVISTA) 60 MG tablet Take 1 tablet (60 mg total) by mouth daily.   No facility-administered encounter medications on file as of 01/14/2023.     Lab Results  Component Value Date   WBC  6.2 12/29/2019   HGB 13.2 12/29/2019   HCT 39.6 12/29/2019   PLT 252.0 12/29/2019   GLUCOSE 93 12/29/2019   CHOL 195 01/26/2022   TRIG 125.0 01/26/2022   HDL 54.90 01/26/2022   LDLCALC 115 (H) 01/26/2022   ALT 15 12/29/2019   AST 26 12/29/2019   NA 140 12/29/2019   K 4.3 12/29/2019   CL 104 12/29/2019   CREATININE 1.00 12/29/2019   BUN 15 12/29/2019   CO2 32 12/29/2019   TSH 1.53 07/09/2019    MM 3D SCREEN BREAST BILATERAL  Result Date: 02/20/2022 CLINICAL DATA:  Screening. EXAM: DIGITAL SCREENING BILATERAL MAMMOGRAM WITH TOMOSYNTHESIS AND CAD TECHNIQUE: Bilateral screening digital craniocaudal and mediolateral oblique mammograms were obtained. Bilateral screening digital breast tomosynthesis was performed. The images were evaluated with computer-aided detection. COMPARISON:  Previous exam(s). ACR Breast Density Category c: The breast tissue is heterogeneously dense, which may obscure small masses. FINDINGS: There are no findings suspicious for malignancy. IMPRESSION: No mammographic evidence of malignancy. A result letter of this screening mammogram will be mailed directly to the patient. RECOMMENDATION: Screening mammogram in one year. (Code:SM-B-01Y) BI-RADS CATEGORY  1: Negative. Electronically Signed   By: Annia Belt M.D.   On: 02/20/2022 06:04       Assessment & Plan:  Need for influenza vaccination -     Flu Vaccine Trivalent High Dose (Fluad)  Visit for screening mammogram -     3D Screening Mammogram, Left and  Right; Future  Stress Assessment & Plan: On wellbutrin. Doing well on this dose.  Has good support.  Follow.    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  Started gemcitabine 01/08/23.  Tolerated.  No problems.    Osteopenia, unspecified location Assessment & Plan: On evista.  Will d/w oncology regarding continuing.    Hypertension, unspecified  type Assessment & Plan: Blood pressure doing better overall.  Continue amlodipine as she is doing.  Continue to follow pressures.  Follow metabolic panel.    Pain in both knees, unspecified chronicity Assessment & Plan: Due to get knee injection 02/2023.    History of COVID-19 Assessment & Plan: Diagnosed with covid in 12/2022.  Treated with paxlovid.  Doing well.  No residual problems.  Follow.    Other orders -     amLODIPine Besylate; Take 1 tablet (5 mg total) by mouth daily.  Dispense: 90 tablet; Refill: 1 -     Magnesium Oxide -Mg Supplement; Take 1 tablet (400 mg total) by mouth daily.  Dispense: 90 tablet; Refill: 1 -     Raloxifene HCl; Take 1 tablet (60 mg total) by mouth daily.  Dispense: 90 tablet; Refill: 1     Dale Luyando, MD

## 2023-01-20 ENCOUNTER — Encounter: Payer: Self-pay | Admitting: Internal Medicine

## 2023-01-20 DIAGNOSIS — Z8616 Personal history of COVID-19: Secondary | ICD-10-CM | POA: Insufficient documentation

## 2023-01-20 NOTE — Assessment & Plan Note (Signed)
Due to get knee injection 02/2023.

## 2023-01-20 NOTE — Assessment & Plan Note (Signed)
Diagnosed with covid in 12/2022.  Treated with paxlovid.  Doing well.  No residual problems.  Follow.

## 2023-01-20 NOTE — Assessment & Plan Note (Signed)
On wellbutrin. Doing well on this dose.  Has good support.  Follow.  

## 2023-01-20 NOTE — Assessment & Plan Note (Signed)
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  Started gemcitabine 01/08/23.  Tolerated.  No problems.

## 2023-01-20 NOTE — Assessment & Plan Note (Signed)
On evista.  Will d/w oncology regarding continuing.

## 2023-01-20 NOTE — Assessment & Plan Note (Signed)
Blood pressure doing better overall.  Continue amlodipine as she is doing.  Continue to follow pressures.  Follow metabolic panel.

## 2023-03-06 ENCOUNTER — Ambulatory Visit
Admission: RE | Admit: 2023-03-06 | Discharge: 2023-03-06 | Disposition: A | Discharge status: Home / Self Care | Payer: Medicare HMO | Source: Ambulatory Visit | Attending: Internal Medicine | Admitting: Internal Medicine

## 2023-03-06 DIAGNOSIS — Z1231 Encounter for screening mammogram for malignant neoplasm of breast: Secondary | ICD-10-CM | POA: Diagnosis present

## 2023-05-17 ENCOUNTER — Ambulatory Visit (INDEPENDENT_AMBULATORY_CARE_PROVIDER_SITE_OTHER): Payer: Medicare HMO | Admitting: Internal Medicine

## 2023-05-17 VITALS — BP 120/68 | HR 81 | Temp 98.0°F | Resp 16 | Ht 62.0 in | Wt 117.0 lb

## 2023-05-17 DIAGNOSIS — C569 Malignant neoplasm of unspecified ovary: Secondary | ICD-10-CM | POA: Diagnosis not present

## 2023-05-17 DIAGNOSIS — Z0000 Encounter for general adult medical examination without abnormal findings: Secondary | ICD-10-CM

## 2023-05-17 DIAGNOSIS — F439 Reaction to severe stress, unspecified: Secondary | ICD-10-CM

## 2023-05-17 DIAGNOSIS — I10 Essential (primary) hypertension: Secondary | ICD-10-CM | POA: Diagnosis not present

## 2023-05-17 MED ORDER — MAGNESIUM OXIDE -MG SUPPLEMENT 400 (240 MG) MG PO TABS: 400.0000 mg | ORAL_TABLET | Freq: Every day | ORAL | 1 refills | Status: AC

## 2023-05-17 MED ORDER — RALOXIFENE HCL 60 MG PO TABS: 60.0000 mg | ORAL_TABLET | Freq: Every day | ORAL | 1 refills | Status: DC

## 2023-05-17 MED ORDER — AMLODIPINE BESYLATE 5 MG PO TABS: 5.0000 mg | ORAL_TABLET | Freq: Every day | ORAL | 1 refills | Status: DC

## 2023-05-17 MED ORDER — BUPROPION HCL 75 MG PO TABS: 75.0000 mg | ORAL_TABLET | Freq: Every day | ORAL | 1 refills | Status: DC

## 2023-05-17 NOTE — Progress Notes (Signed)
Subjective:    Patient ID: Yolanda Perkins, female    DOB: [birth year], 73 y.o.   MRN: [id number]  Patient here for  Chief Complaint  Patient presents with   Annual Exam    HPI Here for a physical exam.  Had f/u with Dr Sonia Side 04/30/23 - f/u high grade serous ovarian adenocarcinoma. Received chemotherapy. Progression by CA 125, symptoms and CT. Being treated and followed by oncology. Is exercising - walking and riding bike. PT - exercise. Also does quilting and sewing. Breathing overall stable. No increased cough or congestion reported. Does report loose stool - takes imodium prn. Some decreased appetite. Discussed benefiber.    Past Medical History:  Diagnosis Date   Chicken pox    Depression    Migraines    Urinary tract bacterial infections    Past Surgical History:  Procedure Laterality Date   ABDOMINAL HYSTERECTOMY N/A 05/05/2020   HERNIA REPAIR  04/02/1960   TONSILLECTOMY AND ADENOIDECTOMY  04/02/1952   Family History  Problem Relation Age of Onset   Arthritis Mother        rheumatoid -which is in remission   Osteoporosis Mother    Transient ischemic attack Mother    Congestive Heart Failure Mother    Diverticulitis Mother    Ovarian cancer Mother    Macular degeneration Mother    Heart disease Father        s/p bypass surgery   Macular degeneration Father    Breast cancer Maternal Aunt    Uterine cancer Maternal Aunt    Uterine cancer Maternal Grandmother    Breast cancer Maternal Aunt    Breast cancer Cousin 27   Social History   Socioeconomic History   Marital status: Married    Spouse name: Not on file   Number of children: Not on file   Years of education: Not on file   Highest education level: Bachelor's degree (e.g., BA, AB, BS)  Occupational History   Not on file  Tobacco Use   Smoking status: Never   Smokeless tobacco: Never  Substance and Sexual Activity   Alcohol use: No    Alcohol/week: 0.0 standard drinks of alcohol   Drug use: No    Sexual activity: Not on file  Other Topics Concern   Not on file  Social History Narrative   She is married and has 2 daughters.   Social Drivers of Corporate investment banker Strain: Low Risk  (05/16/2023)   Overall Financial Resource Strain (CARDIA)    Difficulty of Paying Living Expenses: Not hard at all  Food Insecurity: No Food Insecurity (05/16/2023)   Hunger Vital Sign    Worried About Running Out of Food in the Last Year: Never true    Ran Out of Food in the Last Year: Never true  Transportation Needs: No Transportation Needs (05/16/2023)   PRAPARE - Administrator, Civil Service (Medical): No    Lack of Transportation (Non-Medical): No  Physical Activity: Insufficiently Active (05/16/2023)   Exercise Vital Sign    Days of Exercise per Week: 3 days    Minutes of Exercise per Session: 20 min  Stress: No Stress Concern Present (05/16/2023)   [employer] of Occupational Health - Occupational Stress Questionnaire    Feeling of Stress : Only a little  Social Connections: Socially Integrated (05/16/2023)   Social Connection and Isolation Panel [NHANES]    Frequency of Communication with Friends and Family: More than three times a week  Frequency of Social Gatherings with Friends and Family: More than three times a week    Attends Religious Services: More than 4 times per year    Active Member of Clubs or Organizations: Yes    Attends Engineer, structural: More than 4 times per year    Marital Status: Married     Review of Systems  Constitutional:  Negative for unexpected weight change.       Some decreased appetite.   HENT:  Negative for congestion, sinus pressure and sore throat.   Eyes:  Negative for pain and visual disturbance.  Respiratory:  Negative for cough, chest tightness and shortness of breath.   Cardiovascular:  Negative for chest pain, palpitations and leg swelling.  Gastrointestinal:  Negative for abdominal pain, constipation, nausea  and vomiting.       Loose stool as outlined.   Genitourinary:  Negative for difficulty urinating and dysuria.  Musculoskeletal:  Negative for joint swelling and myalgias.  Skin:  Negative for color change and rash.  Neurological:  Negative for dizziness and headaches.  Hematological:  Negative for adenopathy. Does not bruise/bleed easily.  Psychiatric/Behavioral:  Negative for agitation and dysphoric mood.        Objective:     BP 120/68   Pulse 81   Temp 98 F (36.7 C)   Resp 16   Ht 5\' 2"  (1.575 m)   Wt 117 lb (53.1 kg)   LMP 04/19/1999   SpO2 98%   BMI 21.40 kg/m  Wt Readings from Last 3 Encounters:  05/17/23 117 lb (53.1 kg)  01/14/23 119 lb 12.8 oz (54.3 kg)  09/20/22 119 lb (54 kg)    Physical Exam Vitals reviewed.  Constitutional:      General: She is not in acute distress.    Appearance: Normal appearance. She is well-developed.  HENT:     Head: Normocephalic and atraumatic.     Right Ear: External ear normal.     Left Ear: External ear normal.     Mouth/Throat:     Pharynx: No oropharyngeal exudate or posterior oropharyngeal erythema.  Eyes:     General: No scleral icterus.       Right eye: No discharge.        Left eye: No discharge.     Conjunctiva/sclera: Conjunctivae normal.  Neck:     Thyroid: No thyromegaly.  Cardiovascular:     Rate and Rhythm: Normal rate and regular rhythm.  Pulmonary:     Effort: No tachypnea, accessory muscle usage or respiratory distress.     Breath sounds: Normal breath sounds. No decreased breath sounds or wheezing.  Chest:  Breasts:    Right: No inverted nipple, mass, nipple discharge or tenderness (no axillary adenopathy).     Left: No inverted nipple, mass, nipple discharge or tenderness (no axilarry adenopathy).  Abdominal:     General: Bowel sounds are normal.     Palpations: Abdomen is soft.     Tenderness: There is no abdominal tenderness.  Musculoskeletal:        General: No swelling or tenderness.      Cervical back: Neck supple.  Lymphadenopathy:     Cervical: No cervical adenopathy.  Skin:    Findings: No erythema or rash.  Neurological:     Mental Status: She is alert and oriented to person, place, and time.  Psychiatric:        Mood and Affect: Mood normal.        Behavior: Behavior  normal.         Outpatient Encounter Medications as of 05/17/2023  Medication Sig   amLODipine (NORVASC) 5 MG tablet Take 1 tablet (5 mg total) by mouth daily.   buPROPion (WELLBUTRIN) 75 MG tablet Take 1 tablet (75 mg total) by mouth daily.   Calcium Carbonate-Vit D-Min 600-400 MG-UNIT TABS Take 1 tablet by mouth 2 (two) times daily.   Cholecalciferol (VITAMIN D3) 1000 UNITS CAPS Take 1 capsule by mouth daily.   fluticasone (FLONASE) 50 MCG/ACT nasal spray Place into the nose.   loratadine (CLARITIN) 10 MG tablet Take by mouth.   magnesium oxide (MAG-OX) 400 (240 Mg) MG tablet Take 1 tablet (400 mg total) by mouth daily.   MAGNESIUM PO Take by mouth.   melatonin 3 MG TABS tablet Take 3 mg by mouth at bedtime.   meloxicam (MOBIC) 7.5 MG tablet Take 1 tablet (7.5 mg total) by mouth daily as needed for pain. With meals   Multiple Vitamin (MULTIVITAMIN) tablet Take 1 tablet by mouth daily.   omeprazole (PRILOSEC) 20 MG capsule Take 1 capsule (20 mg total) by mouth daily.   ondansetron (ZOFRAN) 8 MG tablet Take by mouth.   pyridoxine (B-6) 100 MG tablet Take 100 mg by mouth daily.   raloxifene (EVISTA) 60 MG tablet Take 1 tablet (60 mg total) by mouth daily.   SENNA PLUS 8.6-50 MG tablet    SUMAtriptan (IMITREX) 50 MG tablet Take 0.5 tablets (25 mg total) by mouth as needed for migraine. May repeat in 2 hours if headache persists or recurs.   triamcinolone cream (KENALOG) 0.1 % Apply 1 application topically 2 (two) times daily.   UNABLE TO FIND Med Name: ZnC3   zinc gluconate 50 MG tablet Take 50 mg by mouth daily.   [DISCONTINUED] amLODipine (NORVASC) 5 MG tablet Take 1 tablet (5 mg total) by mouth  daily.   [DISCONTINUED] buPROPion (WELLBUTRIN) 75 MG tablet Take 1 tablet (75 mg total) by mouth daily.   [DISCONTINUED] magnesium oxide (MAG-OX) 400 (240 Mg) MG tablet Take 1 tablet (400 mg total) by mouth daily.   [DISCONTINUED] raloxifene (EVISTA) 60 MG tablet Take 1 tablet (60 mg total) by mouth daily.   No facility-administered encounter medications on file as of 05/17/2023.     Lab Results  Component Value Date   WBC 6.2 12/29/2019   HGB 13.2 12/29/2019   HCT 39.6 12/29/2019   PLT 252.0 12/29/2019   GLUCOSE 93 12/29/2019   CHOL 195 01/26/2022   TRIG 125.0 01/26/2022   HDL 54.90 01/26/2022   LDLCALC 115 (H) 01/26/2022   ALT 15 12/29/2019   AST 26 12/29/2019   NA 140 12/29/2019   K 4.3 12/29/2019   CL 104 12/29/2019   CREATININE 1.00 12/29/2019   BUN 15 12/29/2019   CO2 32 12/29/2019   TSH 1.53 07/09/2019    MM 3D SCREENING MAMMOGRAM BILATERAL BREAST Result Date: 03/08/2023 CLINICAL DATA:  Screening. EXAM: DIGITAL SCREENING BILATERAL MAMMOGRAM WITH TOMOSYNTHESIS AND CAD TECHNIQUE: Bilateral screening digital craniocaudal and mediolateral oblique mammograms were obtained. Bilateral screening digital breast tomosynthesis was performed. The images were evaluated with computer-aided detection. COMPARISON:  Previous exam(s). ACR Breast Density Category c: The breasts are heterogeneously dense, which may obscure small masses. FINDINGS: There are no findings suspicious for malignancy. IMPRESSION: No mammographic evidence of malignancy. A result letter of this screening mammogram will be mailed directly to the patient. RECOMMENDATION: Screening mammogram in one year. (Code:SM-B-01Y) BI-RADS CATEGORY  1: Negative. Electronically Signed  By: Elberta Fortis M.D.   On: 03/08/2023 11:55       Assessment & Plan:  Stress Assessment & Plan: On wellbutrin. Appears to be doing well.  Has good support.  Follow.    Health care maintenance Assessment & Plan: Physical today 05/17/23.   Mammogram 03/06/23 - Birads I.  Colonoscopy 16109 - 10 year f/u.    Malignant neoplasm of ovary, unspecified laterality [facility]) Assessment & Plan:  Seeing oncology for f/u and treatment - high grade serous ovarian cancer.  Started gemcitabine 01/08/23.     Hypertension, unspecified type Assessment & Plan: Blood pressure as outlined.  Continue amlodipine as she is doing.  Continue to follow pressures.  Follow metabolic panel.    Other orders -     amLODIPine Besylate; Take 1 tablet (5 mg total) by mouth daily.  Dispense: 90 tablet; Refill: 1 -     buPROPion HCl; Take 1 tablet (75 mg total) by mouth daily.  Dispense: 90 tablet; Refill: 1 -     Magnesium Oxide -Mg Supplement; Take 1 tablet (400 mg total) by mouth daily.  Dispense: 90 tablet; Refill: 1 -     Raloxifene HCl; Take 1 tablet (60 mg total) by mouth daily.  Dispense: 90 tablet; Refill: 1     Dale Hamlin, MD

## 2023-05-17 NOTE — Assessment & Plan Note (Signed)
Physical today 05/17/23.  Mammogram 03/06/23 - Birads I.  Colonoscopy 16109 - 10 year f/u.

## 2023-05-18 ENCOUNTER — Encounter: Payer: Self-pay | Admitting: Internal Medicine

## 2023-05-18 NOTE — Assessment & Plan Note (Signed)
On wellbutrin. Appears to be doing well.  Has good support.  Follow.

## 2023-05-18 NOTE — Assessment & Plan Note (Signed)
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  Started gemcitabine 01/08/23.

## 2023-05-18 NOTE — Assessment & Plan Note (Signed)
Blood pressure as outlined.  Continue amlodipine as she is doing.  Continue to follow pressures.  Follow metabolic panel.

## 2023-06-20 ENCOUNTER — Other Ambulatory Visit: Payer: Self-pay | Admitting: Internal Medicine

## 2023-06-20 NOTE — Telephone Encounter (Signed)
 Lvm for pt to give office a call back. Wanted to confirm if pt is taking meds and needed a refill

## 2023-06-21 MED ORDER — MELOXICAM 7.5 MG PO TABS: 7.5000 mg | ORAL_TABLET | Freq: Every day | ORAL | 0 refills | Status: DC | PRN

## 2023-09-23 ENCOUNTER — Ambulatory Visit (INDEPENDENT_AMBULATORY_CARE_PROVIDER_SITE_OTHER): Payer: Medicare HMO | Admitting: *Deleted

## 2023-09-23 VITALS — Ht 62.0 in | Wt 117.0 lb

## 2023-09-23 DIAGNOSIS — Z0000 Encounter for general adult medical examination without abnormal findings: Secondary | ICD-10-CM | POA: Diagnosis not present

## 2023-09-23 NOTE — Patient Instructions (Signed)
 Ms. Neal , Thank you for taking time out of your busy schedule to complete your Annual Wellness Visit with me. I enjoyed our conversation and look forward to speaking with you again next year. I, as well as your care team,  appreciate your ongoing commitment to your health goals. Please review the following plan we discussed and let me know if I can assist you in the future. Your Game plan/ To Do List    Referrals: If you haven't heard from the office you've been referred to, please reach out to them at the phone provided.  Remember to update your tetanus and shingles vaccines Follow up Visits: Next Medicare AWV with our clinical staff: 09/23/24 @ 8:50   Have you seen your provider in the last 6 months (3 months if uncontrolled diabetes)? Yes Next Office Visit with your provider: 11/20/23  Clinician Recommendations:  Aim for 30 minutes of exercise or brisk walking, 6-8 glasses of water, and 5 servings of fruits and vegetables each day.       This is a list of the screening recommended for you and due dates:  Health Maintenance  Topic Date Due   Zoster (Shingles) Vaccine (1 of 2) 06/16/1969   COVID-19 Vaccine (3 - Moderna risk series) 07/30/2019   DTaP/Tdap/Td vaccine (2 - Td or Tdap) 09/01/2023   Flu Shot  11/01/2023   Mammogram  03/05/2024   Medicare Annual Wellness Visit  09/22/2024   Colon Cancer Screening  10/19/2024   Pneumococcal Vaccine for age over 68  Completed   DEXA scan (bone density measurement)  Completed   Hepatitis C Screening  Completed   HPV Vaccine  Aged Out   Meningitis B Vaccine  Aged Out    Advanced directives: (Copy Requested) Please bring a copy of your health care power of attorney and living will to the office to be added to your chart at your convenience. You can mail to [facility] [address] [place], [place] [address] or email to [email or web address] Advance Care Planning is important because it:  [x]  Makes sure you receive the  medical care that is consistent with your values, goals, and preferences  [x]  It provides guidance to your family and loved ones and reduces their decisional burden about whether or not they are making the right decisions based on your wishes.

## 2023-09-23 NOTE — Progress Notes (Signed)
 Subjective:   Yolanda Perkins is a 73 y.o. who presents for a Medicare Wellness preventive visit.  As a reminder, Annual Wellness Visits don't include a physical exam, and some assessments may be limited, especially if this visit is performed virtually. We may recommend an in-person follow-up visit with your provider if needed.  Visit Complete: Virtual I connected with  Yolanda Perkins Cancer on 09/23/23 by a audio enabled telemedicine application and verified that I am speaking with the correct person using two identifiers.  Patient Location: Home  Provider Location: Home Office  I discussed the limitations of evaluation and management by telemedicine. The patient expressed understanding and agreed to proceed.  Vital Signs: Because this visit was a virtual/telehealth visit, some criteria may be missing or patient reported. Any vitals not documented were not able to be obtained and vitals that have been documented are patient reported.  VideoDeclined- This patient declined Librarian, academic. Therefore the visit was completed with audio only.  Persons Participating in Visit: Patient.  AWV Questionnaire: Yes: Patient Medicare AWV questionnaire was completed by the patient on 09/20/23; I have confirmed that all information answered by patient is correct and no changes since this date.  Cardiac Risk Factors include: advanced age (>39men, >69 women);hypertension     Objective:    Today's Vitals   09/23/23 1055  Weight: 117 lb (53.1 kg)  Height: 5' 2 (1.575 m)   Body mass index is 21.4 kg/m.     09/23/2023   11:11 AM 09/20/2022   10:51 AM 09/18/2021   11:09 AM 09/15/2020    9:16 AM 07/24/2019   10:48 AM 07/23/2018   10:38 AM  Advanced Directives  Does Patient Have a Medical Advance Directive? Yes No No No Yes No  Type of Estate agent of [place];Living will       Does patient want to make changes to medical advance directive?     No -  Patient declined   Copy of Healthcare Power of Attorney in Chart? No - copy requested       Would patient like information on creating a medical advance directive?  No - Patient declined No - Patient declined No - Patient declined  No - Patient declined      Data saved with a previous flowsheet row definition    Current Medications (verified) Outpatient Encounter Medications as of 09/23/2023  Medication Sig   amLODipine  (NORVASC ) 5 MG tablet Take 1 tablet (5 mg total) by mouth daily.   buPROPion  (WELLBUTRIN ) 75 MG tablet Take 1 tablet (75 mg total) by mouth daily.   Calcium  Carbonate-Vit D-Min 600-400 MG-UNIT TABS Take 1 tablet by mouth 2 (two) times daily.   Cholecalciferol (VITAMIN D3) 1000 UNITS CAPS Take 1 capsule by mouth daily.   fluticasone (FLONASE) 50 MCG/ACT nasal spray Place into the nose.   loratadine (CLARITIN) 10 MG tablet Take by mouth.   magnesium  oxide (MAG-OX) 400 (240 Mg) MG tablet Take 1 tablet (400 mg total) by mouth daily.   melatonin 3 MG TABS tablet Take 3 mg by mouth at bedtime.   meloxicam  (MOBIC ) 7.5 MG tablet Take 1 tablet (7.5 mg total) by mouth daily as needed for pain. With meals   Multiple Vitamin (MULTIVITAMIN) tablet Take 1 tablet by mouth daily.   omeprazole  (PRILOSEC) 20 MG capsule Take 1 capsule (20 mg total) by mouth daily.   ondansetron (ZOFRAN) 8 MG tablet Take by mouth.   pyridoxine (B-6) 100  MG tablet Take 100 mg by mouth daily.   raloxifene  (EVISTA ) 60 MG tablet Take 1 tablet (60 mg total) by mouth daily.   SENNA PLUS 8.6-50 MG tablet    SUMAtriptan  (IMITREX ) 50 MG tablet Take 0.5 tablets (25 mg total) by mouth as needed for migraine. May repeat in 2 hours if headache persists or recurs.   triamcinolone  cream (KENALOG ) 0.1 % Apply 1 application topically 2 (two) times daily.   zinc gluconate 50 MG tablet Take 50 mg by mouth daily.   MAGNESIUM  PO Take by mouth. (Patient not taking: Reported on 09/23/2023)   UNABLE TO FIND Med Name: Malva (Patient  not taking: Reported on 09/23/2023)   No facility-administered encounter medications on file as of 09/23/2023.    Allergies (verified) Penicillins   History: Past Medical History:  Diagnosis Date   Chicken pox    Depression    Migraines    Urinary tract bacterial infections    Past Surgical History:  Procedure Laterality Date   ABDOMINAL HYSTERECTOMY N/A 05/05/2020   HERNIA REPAIR  04/02/1960   TONSILLECTOMY AND ADENOIDECTOMY  04/02/1952   Family History  Problem Relation Age of Onset   Arthritis Mother        rheumatoid -which is in remission   Osteoporosis Mother    Transient ischemic attack Mother    Congestive Heart Failure Mother    Diverticulitis Mother    Ovarian cancer Mother    Macular degeneration Mother    Heart disease Father        s/p bypass surgery   Macular degeneration Father    Breast cancer Maternal Aunt    Uterine cancer Maternal Aunt    Uterine cancer Maternal Grandmother    Breast cancer Maternal Aunt    Breast cancer Cousin 75   Social History   Socioeconomic History   Marital status: Married    Spouse name: Not on file   Number of children: Not on file   Years of education: Not on file   Highest education level: Bachelor's degree (e.g., BA, AB, BS)  Occupational History   Not on file  Tobacco Use   Smoking status: Never   Smokeless tobacco: Never  Substance and Sexual Activity   Alcohol use: No    Alcohol/week: 0.0 standard drinks of alcohol   Drug use: No   Sexual activity: Not on file  Other Topics Concern   Not on file  Social History Narrative   She is married and has 2 daughters.   Social Drivers of Corporate investment banker Strain: Low Risk  (09/20/2023)   Overall Financial Resource Strain (CARDIA)    Difficulty of Paying Living Expenses: Not hard at all  Food Insecurity: No Food Insecurity (09/20/2023)   Hunger Vital Sign    Worried About Running Out of Food in the Last Year: Never true    Ran Out of Food in the Last  Year: Never true  Transportation Needs: No Transportation Needs (09/20/2023)   PRAPARE - Administrator, Civil Service (Medical): No    Lack of Transportation (Non-Medical): No  Physical Activity: Insufficiently Active (09/20/2023)   Exercise Vital Sign    Days of Exercise per Week: 4 days    Minutes of Exercise per Session: 10 min  Stress: No Stress Concern Present (09/20/2023)   [employer] of Occupational Health - Occupational Stress Questionnaire    Feeling of Stress: Only a little  Social Connections: Socially Integrated (09/20/2023)   Social  Connection and Isolation Panel    Frequency of Communication with Friends and Family: More than three times a week    Frequency of Social Gatherings with Friends and Family: More than three times a week    Attends Religious Services: More than 4 times per year    Active Member of [employer] or Organizations: Yes    Attends Engineer, structural: More than 4 times per year    Marital Status: Married    Tobacco Counseling Counseling given: Not Answered    Clinical Intake:  Pre-visit preparation completed: Yes  Pain : No/denies pain     BMI - recorded: 21.4 Nutritional Status: BMI of 19-24  Normal Nutritional Risks: None Diabetes: No  No results found for: HGBA1C   How often do you need to have someone help you when you read instructions, pamphlets, or other written materials from your doctor or pharmacy?: 1 - Never  Interpreter Needed?: No  Information entered by :: R. Even Budlong LPN   Activities of Daily Living     09/20/2023   10:13 AM  In your present state of health, do you have any difficulty performing the following activities:  Hearing? 0  Vision? 0  Difficulty concentrating or making decisions? 0  Walking or climbing stairs? 0  Dressing or bathing? 0  Doing errands, shopping? 0  Preparing Food and eating ? N  Using the Toilet? N  In the past six months, have you accidently leaked urine? Y  Do  you have problems with loss of bowel control? N  Managing your Medications? N  Managing your Finances? N  Housekeeping or managing your Housekeeping? N    Patient Care Team: Glendia Shad, MD as PCP - General (Internal Medicine) Elby Webb Loges, MD as Referring Physician (Obstetrics)  I have updated your Care Teams any recent Medical Services you may have received from other providers in the past year.     Assessment:   This is a routine wellness examination for Yolanda Perkins.  Hearing/Vision screen Hearing Screening - Comments:: No issues Vision Screening - Comments:: glasses   Goals Addressed             This Visit's Progress    Patient Stated       Wants to get her kayak out and back in the water        Depression Screen     09/23/2023   11:06 AM 01/14/2023    1:19 PM 09/20/2022   10:46 AM 01/26/2022    8:10 AM 12/21/2021    7:35 AM 11/27/2021   11:31 AM 10/19/2021    8:21 AM  PHQ 2/9 Scores  PHQ - 2 Score 0 0 0 0 0 0 0  PHQ- 9 Score 0          Fall Risk     09/20/2023   10:13 AM 01/14/2023    1:19 PM 09/20/2022   10:51 AM 01/26/2022    8:09 AM 12/21/2021    7:35 AM  Fall Risk   Falls in the past year? 0 0 0 1 0  Number falls in past yr: 0 0 0 1   Injury with Fall? 0 0 0 0   Risk for fall due to : No Fall Risks No Fall Risks No Fall Risks No Fall Risks No Fall Risks  Follow up Falls evaluation completed;Falls prevention discussed Falls evaluation completed Falls prevention discussed Falls evaluation completed  Falls evaluation completed      Data saved with  a previous flowsheet row definition    MEDICARE RISK AT HOME:  Medicare Risk at Home Any stairs in or around the home?: (Patient-Rptd) Yes If so, are there any without handrails?: (Patient-Rptd) Yes Home free of loose throw rugs in walkways, pet beds, electrical cords, etc?: (Patient-Rptd) Yes Adequate lighting in your home to reduce risk of falls?: (Patient-Rptd) Yes Life alert?: (Patient-Rptd)  No Use of a cane, walker or w/c?: (Patient-Rptd) No Grab bars in the bathroom?: (Patient-Rptd) Yes Shower chair or bench in shower?: (Patient-Rptd) Yes Elevated toilet seat or a handicapped toilet?: (Patient-Rptd) No  TIMED UP AND GO:  Was the test performed?  No  Cognitive Function: 6CIT completed        09/23/2023   11:14 AM 09/23/2023   11:11 AM 09/20/2022   10:52 AM 07/24/2019   10:58 AM 07/23/2018   10:42 AM  6CIT Screen  What Year? 0 points 0 points 0 points 0 points 0 points  What month?  0 points 0 points 0 points 0 points  What time?  0 points 0 points 0 points 0 points  Count back from 20 0 points  0 points  0 points  Months in reverse 0 points  0 points 0 points 0 points  Repeat phrase 0 points  0 points  0 points  Total Score   0 points  0 points    Immunizations Immunization History  Administered Date(s) Administered   Fluad Quad(high Dose 65+) 12/25/2018, 02/15/2021, 12/21/2021   Fluad Trivalent(High Dose 65+) 01/14/2023   Hepatitis B 04/03/1991   Influenza Split 01/17/2012   Influenza, High Dose Seasonal PF 04/23/2016, 02/04/2018   Influenza,inj,Quad PF,6+ Mos 12/21/2013, 01/25/2015   Influenza-Unspecified 12/21/2013, 01/25/2015, 04/23/2016, 03/04/2017, 01/01/2020, 01/20/2020   Moderna Sars-Covid-2 Vaccination 06/03/2019, 07/02/2019   Pneumococcal Conjugate-13 07/27/2014   Pneumococcal Polysaccharide-23 03/11/2018   Tdap 08/31/2013   Zoster, Live 05/27/2015    Screening Tests Health Maintenance  Topic Date Due   Zoster Vaccines- Shingrix (1 of 2) 06/16/1967   COVID-19 Vaccine (3 - Moderna risk series) 07/30/2019   DTaP/Tdap/Td (2 - Td or Tdap) 09/01/2023   INFLUENZA VACCINE  11/01/2023   MAMMOGRAM  03/05/2024   Medicare Annual Wellness (AWV)  09/22/2024   Colonoscopy  10/19/2024   Pneumococcal Vaccine: 50+ Years  Completed   DEXA SCAN  Completed   Hepatitis C Screening  Completed   HPV VACCINES  Aged Out   Meningococcal B Vaccine  Aged Out     Health Maintenance  Health Maintenance Due  Topic Date Due   Zoster Vaccines- Shingrix (1 of 2) 06/16/1967   COVID-19 Vaccine (3 - Moderna risk series) 07/30/2019   DTaP/Tdap/Td (2 - Td or Tdap) 09/01/2023   Health Maintenance Items Addressed: Discussed the need to update tetanus and shingles vaccines. Patient declines covid vaccine.  Additional Screening:  Vision Screening: Recommended annual ophthalmology exams for early detection of glaucoma and other disorders of the eye. Up to date [facility]  [place] Would you like a referral to an eye doctor? No    Dental Screening: Recommended annual dental exams for proper oral hygiene  Community Resource Referral / Chronic Care Management: CRR required this visit?  No   CCM required this visit?  No   Plan:    I have personally reviewed and noted the following in the patient's chart:   Medical and social history Use of alcohol, tobacco or illicit drugs  Current medications and supplements including opioid prescriptions. Patient is not currently taking opioid  prescriptions. Functional ability and status Nutritional status Physical activity Advanced directives List of other physicians Hospitalizations, surgeries, and ER visits in previous 12 months Vitals Screenings to include cognitive, depression, and falls Referrals and appointments  In addition, I have reviewed and discussed with patient certain preventive protocols, quality metrics, and best practice recommendations. A written personalized care plan for preventive services as well as general preventive health recommendations were provided to patient.   Angeline Fredericks, LPN   [id number]   After Visit Summary: (MyChart) Due to this being a telephonic visit, the after visit summary with patients personalized plan was offered to patient via MyChart   Notes: Nothing significant to report at this time.

## 2023-10-30 ENCOUNTER — Other Ambulatory Visit: Payer: Self-pay | Admitting: Internal Medicine

## 2023-11-14 ENCOUNTER — Ambulatory Visit: Payer: Medicare HMO | Admitting: Internal Medicine

## 2023-11-20 ENCOUNTER — Ambulatory Visit (INDEPENDENT_AMBULATORY_CARE_PROVIDER_SITE_OTHER): Admitting: Internal Medicine

## 2023-11-20 ENCOUNTER — Encounter: Payer: Self-pay | Admitting: Internal Medicine

## 2023-11-20 VITALS — BP 118/70 | HR 80 | Resp 16 | Ht 62.0 in | Wt 116.0 lb

## 2023-11-20 DIAGNOSIS — F439 Reaction to severe stress, unspecified: Secondary | ICD-10-CM | POA: Diagnosis not present

## 2023-11-20 DIAGNOSIS — C569 Malignant neoplasm of unspecified ovary: Secondary | ICD-10-CM

## 2023-11-20 DIAGNOSIS — I10 Essential (primary) hypertension: Secondary | ICD-10-CM

## 2023-11-20 DIAGNOSIS — I7090 Unspecified atherosclerosis: Secondary | ICD-10-CM

## 2023-11-20 DIAGNOSIS — M8580 Other specified disorders of bone density and structure, unspecified site: Secondary | ICD-10-CM | POA: Diagnosis not present

## 2023-11-20 NOTE — Progress Notes (Signed)
 Subjective:    Patient ID: [name] Cancer, female    DOB: 11-28-1950, 73 y.o.   MRN: [id number]  Patient here for  Chief Complaint  Patient presents with   Medical Management of Chronic Issues    HPI Here for a scheduled follow up. Had f/u with oncology 10/29/23 - recurrent metastatic ovarian cancer. Currently on carboplatin and doxorubicin. Continues on wellbutrin . Continues f/u with oncology. Overall doing relatively well. Breathing stable. No chest pain reported. No abdominal pain. Bowels stable. Has good support. Had questions about recent scan. Questions regarding mild atherosclerotic plaque.    Past Medical History:  Diagnosis Date   Chicken pox    Depression    Migraines    Urinary tract bacterial infections    Past Surgical History:  Procedure Laterality Date   ABDOMINAL HYSTERECTOMY N/A 05/05/2020   HERNIA REPAIR  04/02/1960   TONSILLECTOMY AND ADENOIDECTOMY  04/02/1952   Family History  Problem Relation Age of Onset   Arthritis Mother        rheumatoid -which is in remission   Osteoporosis Mother    Transient ischemic attack Mother    Congestive Heart Failure Mother    Diverticulitis Mother    Ovarian cancer Mother    Macular degeneration Mother    Heart disease Father        s/p bypass surgery   Macular degeneration Father    Breast cancer Maternal Aunt    Uterine cancer Maternal Aunt    Uterine cancer Maternal Grandmother    Breast cancer Maternal Aunt    Breast cancer Cousin 35   Social History   Socioeconomic History   Marital status: Married    Spouse name: Not on file   Number of children: Not on file   Years of education: Not on file   Highest education level: Bachelor's degree (e.g., BA, AB, BS)  Occupational History   Not on file  Tobacco Use   Smoking status: Never   Smokeless tobacco: Never  Substance and Sexual Activity   Alcohol use: No    Alcohol/week: 0.0 standard drinks of alcohol   Drug use: No   Sexual activity: Not on file   Other Topics Concern   Not on file  Social History Narrative   She is married and has 2 daughters.   Social Drivers of Corporate investment banker Strain: Low Risk  (09/20/2023)   Overall Financial Resource Strain (CARDIA)    Difficulty of Paying Living Expenses: Not hard at all  Food Insecurity: No Food Insecurity (09/20/2023)   Hunger Vital Sign    Worried About Running Out of Food in the Last Year: Never true    Ran Out of Food in the Last Year: Never true  Transportation Needs: No Transportation Needs (09/20/2023)   PRAPARE - Administrator, Civil Service (Medical): No    Lack of Transportation (Non-Medical): No  Physical Activity: Insufficiently Active (09/20/2023)   Exercise Vital Sign    Days of Exercise per Week: 4 days    Minutes of Exercise per Session: 10 min  Stress: No Stress Concern Present (09/20/2023)   [employer] of Occupational Health - Occupational Stress Questionnaire    Feeling of Stress: Only a little  Social Connections: Socially Integrated (09/20/2023)   Social Connection and Isolation Panel    Frequency of Communication with Friends and Family: More than three times a week    Frequency of Social Gatherings with Friends and Family: More than three  times a week    Attends Religious Services: More than 4 times per year    Active Member of Clubs or Organizations: Yes    Attends Banker Meetings: More than 4 times per year    Marital Status: Married     Review of Systems  Constitutional:  Negative for appetite change and unexpected weight change.  HENT:  Negative for congestion and sinus pressure.   Respiratory:  Negative for cough, chest tightness and shortness of breath.   Cardiovascular:  Negative for chest pain, palpitations and leg swelling.  Gastrointestinal:  Negative for abdominal pain, diarrhea, nausea and vomiting.  Genitourinary:  Negative for difficulty urinating and dysuria.  Musculoskeletal:  Negative for joint  swelling and myalgias.  Skin:  Negative for color change and rash.  Neurological:  Negative for dizziness and headaches.  Psychiatric/Behavioral:  Negative for agitation and dysphoric mood.        Objective:     BP 118/70   Pulse 80   Resp 16   Ht 5' 2 (1.575 m)   Wt 116 lb (52.6 kg)   LMP 04/19/1999   SpO2 99%   BMI 21.22 kg/m  Wt Readings from Last 3 Encounters:  11/20/23 116 lb (52.6 kg)  09/23/23 117 lb (53.1 kg)  05/17/23 117 lb (53.1 kg)    Physical Exam Vitals reviewed.  Constitutional:      General: She is not in acute distress.    Appearance: Normal appearance.  HENT:     Head: Normocephalic and atraumatic.     Right Ear: External ear normal.     Left Ear: External ear normal.     Mouth/Throat:     Pharynx: No oropharyngeal exudate or posterior oropharyngeal erythema.  Eyes:     General: No scleral icterus.       Right eye: No discharge.        Left eye: No discharge.     Conjunctiva/sclera: Conjunctivae normal.  Neck:     Thyroid : No thyromegaly.  Cardiovascular:     Rate and Rhythm: Normal rate and regular rhythm.  Pulmonary:     Effort: No respiratory distress.     Breath sounds: Normal breath sounds. No wheezing.  Abdominal:     General: Bowel sounds are normal.     Palpations: Abdomen is soft.     Tenderness: There is no abdominal tenderness.  Musculoskeletal:        General: No swelling or tenderness.     Cervical back: Neck supple. No tenderness.  Lymphadenopathy:     Cervical: No cervical adenopathy.  Skin:    Findings: No erythema or rash.  Neurological:     Mental Status: She is alert.  Psychiatric:        Mood and Affect: Mood normal.        Behavior: Behavior normal.         Outpatient Encounter Medications as of 11/20/2023  Medication Sig   amLODipine  (NORVASC ) 5 MG tablet Take 1 tablet (5 mg total) by mouth daily.   buPROPion  (WELLBUTRIN ) 75 MG tablet Take 1 tablet (75 mg total) by mouth daily.   Calcium  Carbonate-Vit  D-Min 600-400 MG-UNIT TABS Take 1 tablet by mouth 2 (two) times daily.   Cholecalciferol (VITAMIN D3) 1000 UNITS CAPS Take 1 capsule by mouth daily.   fluticasone (FLONASE) 50 MCG/ACT nasal spray Place into the nose.   loratadine (CLARITIN) 10 MG tablet Take by mouth.   magnesium  oxide (MAG-OX) 400 (240 Mg)  MG tablet Take 1 tablet (400 mg total) by mouth daily.   MAGNESIUM  PO Take by mouth. (Patient not taking: Reported on 09/23/2023)   melatonin 3 MG TABS tablet Take 3 mg by mouth at bedtime.   meloxicam  (MOBIC ) 7.5 MG tablet Take 1 tablet (7.5 mg total) by mouth daily as needed for pain. With meals   Multiple Vitamin (MULTIVITAMIN) tablet Take 1 tablet by mouth daily.   omeprazole  (PRILOSEC) 20 MG capsule Take 1 capsule (20 mg total) by mouth daily.   ondansetron (ZOFRAN) 8 MG tablet Take by mouth.   pyridoxine (B-6) 100 MG tablet Take 100 mg by mouth daily.   raloxifene  (EVISTA ) 60 MG tablet Take 1 tablet (60 mg total) by mouth daily.   SENNA PLUS 8.6-50 MG tablet    SUMAtriptan  (IMITREX ) 50 MG tablet Take 0.5 tablets (25 mg total) by mouth as needed for migraine. May repeat in 2 hours if headache persists or recurs.   triamcinolone  cream (KENALOG ) 0.1 % Apply 1 application topically 2 (two) times daily.   UNABLE TO FIND Med Name: Malva (Patient not taking: Reported on 09/23/2023)   zinc gluconate 50 MG tablet Take 50 mg by mouth daily.   No facility-administered encounter medications on file as of 11/20/2023.     Lab Results  Component Value Date   WBC 6.2 12/29/2019   HGB 13.2 12/29/2019   HCT 39.6 12/29/2019   PLT 252.0 12/29/2019   GLUCOSE 93 12/29/2019   CHOL 195 01/26/2022   TRIG 125.0 01/26/2022   HDL 54.90 01/26/2022   LDLCALC 115 (H) 01/26/2022   ALT 15 12/29/2019   AST 26 12/29/2019   NA 140 12/29/2019   K 4.3 12/29/2019   CL 104 12/29/2019   CREATININE 1.00 12/29/2019   BUN 15 12/29/2019   CO2 32 12/29/2019   TSH 1.53 07/09/2019    MM 3D SCREENING MAMMOGRAM  BILATERAL BREAST Result Date: 03/08/2023 CLINICAL DATA:  Screening. EXAM: DIGITAL SCREENING BILATERAL MAMMOGRAM WITH TOMOSYNTHESIS AND CAD TECHNIQUE: Bilateral screening digital craniocaudal and mediolateral oblique mammograms were obtained. Bilateral screening digital breast tomosynthesis was performed. The images were evaluated with computer-aided detection. COMPARISON:  Previous exam(s). ACR Breast Density Category c: The breasts are heterogeneously dense, which may obscure small masses. FINDINGS: There are no findings suspicious for malignancy. IMPRESSION: No mammographic evidence of malignancy. A result letter of this screening mammogram will be mailed directly to the patient. RECOMMENDATION: Screening mammogram in one year. (Code:SM-B-01Y) BI-RADS CATEGORY  1: Negative. Electronically Signed   By: Toribio Agreste M.D.   On: 03/08/2023 11:55       Assessment & Plan:  Malignant neoplasm of ovary, unspecified laterality [facility]) Assessment & Plan: Had f/u with oncology 10/29/23 - recurrent metastatic ovarian cancer. Currently on carboplatin and doxorubicin. Continue f/u with oncology.    Stress Assessment & Plan: Continues on wellbutrin . Overall stable. No changes today.    Osteopenia, unspecified location Assessment & Plan: Continues on evista . Conitnue calcium , vitamin D  and weight bearing exercise. Follow. Needs f/u bone density.    Hypertension, unspecified type Assessment & Plan: Blood pressure as outlined.  Continue amlodipine  as she is doing.  Continue to follow pressures.  Follow metabolic panel. No changes today.    Atherosclerotic plaque Assessment & Plan: Discussed CT scan results. Continue risk factor modifciation. Consider starting statin. Discussed.       Allena Hamilton, MD

## 2023-11-24 ENCOUNTER — Encounter: Payer: Self-pay | Admitting: Internal Medicine

## 2023-11-24 DIAGNOSIS — I7090 Unspecified atherosclerosis: Secondary | ICD-10-CM | POA: Insufficient documentation

## 2023-11-24 NOTE — Assessment & Plan Note (Signed)
 Discussed CT scan results. Continue risk factor modifciation. Consider starting statin. Discussed.

## 2023-11-24 NOTE — Assessment & Plan Note (Addendum)
 Continues on evista . Conitnue calcium , vitamin D  and weight bearing exercise. Follow. Needs f/u bone density.

## 2023-11-24 NOTE — Assessment & Plan Note (Signed)
 Blood pressure as outlined.  Continue amlodipine  as she is doing.  Continue to follow pressures.  Follow metabolic panel. No changes today.

## 2023-11-24 NOTE — Assessment & Plan Note (Signed)
 Continues on wellbutrin . Overall stable. No changes today.

## 2023-11-24 NOTE — Assessment & Plan Note (Signed)
 Had f/u with oncology 10/29/23 - recurrent metastatic ovarian cancer. Currently on carboplatin and doxorubicin. Continue f/u with oncology.

## 2023-12-19 ENCOUNTER — Other Ambulatory Visit: Payer: Self-pay | Admitting: Internal Medicine

## 2024-01-20 ENCOUNTER — Other Ambulatory Visit: Payer: Self-pay | Admitting: Internal Medicine

## 2024-01-27 ENCOUNTER — Other Ambulatory Visit: Payer: Self-pay | Admitting: Internal Medicine

## 2024-03-04 ENCOUNTER — Other Ambulatory Visit: Payer: Self-pay | Admitting: Internal Medicine

## 2024-03-11 ENCOUNTER — Other Ambulatory Visit: Payer: Self-pay | Admitting: Internal Medicine

## 2024-03-19 ENCOUNTER — Other Ambulatory Visit: Payer: Self-pay | Admitting: Internal Medicine

## 2024-03-19 DIAGNOSIS — Z1231 Encounter for screening mammogram for malignant neoplasm of breast: Secondary | ICD-10-CM

## 2024-04-14 ENCOUNTER — Other Ambulatory Visit: Payer: Self-pay | Admitting: Internal Medicine

## 2024-04-20 ENCOUNTER — Ambulatory Visit
Admission: RE | Admit: 2024-04-20 | Discharge: 2024-04-20 | Disposition: A | Source: Ambulatory Visit | Attending: Internal Medicine | Admitting: Internal Medicine

## 2024-04-20 DIAGNOSIS — Z1231 Encounter for screening mammogram for malignant neoplasm of breast: Secondary | ICD-10-CM | POA: Insufficient documentation

## 2024-04-23 ENCOUNTER — Ambulatory Visit: Payer: Self-pay | Admitting: Internal Medicine

## 2024-05-22 ENCOUNTER — Encounter: Admitting: Internal Medicine

## 2024-09-23 ENCOUNTER — Ambulatory Visit
